# Patient Record
Sex: Female | Born: 1945 | Race: White | Hispanic: No | Smoking: Former smoker
Health system: Southern US, Community
[De-identification: ages and names within clinical notes are randomized; demographics above are authoritative.]

---

## 1997-08-21 ENCOUNTER — Encounter: Admission: RE | Admit: 1997-08-21 | Discharge: 1997-08-21 | Payer: Self-pay | Admitting: Family Medicine

## 1997-08-22 ENCOUNTER — Encounter: Admission: RE | Admit: 1997-08-22 | Discharge: 1997-08-22 | Payer: Self-pay | Admitting: Family Medicine

## 1997-08-28 ENCOUNTER — Encounter: Admission: RE | Admit: 1997-08-28 | Discharge: 1997-08-28 | Payer: Self-pay | Admitting: Family Medicine

## 1997-09-11 ENCOUNTER — Encounter: Admission: RE | Admit: 1997-09-11 | Discharge: 1997-09-11 | Payer: Self-pay | Admitting: Family Medicine

## 1997-12-11 ENCOUNTER — Encounter: Admission: RE | Admit: 1997-12-11 | Discharge: 1997-12-11 | Payer: Self-pay | Admitting: Family Medicine

## 1998-11-23 ENCOUNTER — Encounter: Admission: RE | Admit: 1998-11-23 | Discharge: 1998-11-23 | Payer: Self-pay | Admitting: Family Medicine

## 1999-02-15 ENCOUNTER — Encounter: Admission: RE | Admit: 1999-02-15 | Discharge: 1999-02-15 | Payer: Self-pay | Admitting: Sports Medicine

## 1999-02-15 ENCOUNTER — Encounter: Payer: Self-pay | Admitting: Sports Medicine

## 1999-02-22 ENCOUNTER — Ambulatory Visit (HOSPITAL_COMMUNITY): Admission: RE | Admit: 1999-02-22 | Discharge: 1999-02-22 | Payer: Self-pay | Admitting: Family Medicine

## 1999-02-22 ENCOUNTER — Encounter: Admission: RE | Admit: 1999-02-22 | Discharge: 1999-02-22 | Payer: Self-pay | Admitting: Family Medicine

## 1999-03-11 ENCOUNTER — Encounter: Admission: RE | Admit: 1999-03-11 | Discharge: 1999-03-11 | Payer: Self-pay | Admitting: Family Medicine

## 1999-09-02 ENCOUNTER — Encounter: Admission: RE | Admit: 1999-09-02 | Discharge: 1999-09-02 | Payer: Self-pay | Admitting: Family Medicine

## 1999-09-02 ENCOUNTER — Encounter: Payer: Self-pay | Admitting: Family Medicine

## 1999-09-19 ENCOUNTER — Encounter: Admission: RE | Admit: 1999-09-19 | Discharge: 1999-09-19 | Payer: Self-pay | Admitting: Family Medicine

## 1999-09-25 ENCOUNTER — Encounter: Admission: RE | Admit: 1999-09-25 | Discharge: 1999-09-25 | Payer: Self-pay | Admitting: Family Medicine

## 1999-09-30 ENCOUNTER — Encounter: Admission: RE | Admit: 1999-09-30 | Discharge: 1999-09-30 | Payer: Self-pay | Admitting: Family Medicine

## 1999-10-28 ENCOUNTER — Encounter: Admission: RE | Admit: 1999-10-28 | Discharge: 1999-10-28 | Payer: Self-pay | Admitting: Family Medicine

## 1999-11-25 ENCOUNTER — Encounter: Admission: RE | Admit: 1999-11-25 | Discharge: 1999-11-25 | Payer: Self-pay | Admitting: Family Medicine

## 1999-12-10 ENCOUNTER — Encounter: Admission: RE | Admit: 1999-12-10 | Discharge: 1999-12-10 | Payer: Self-pay | Admitting: Family Medicine

## 2000-01-06 ENCOUNTER — Encounter: Admission: RE | Admit: 2000-01-06 | Discharge: 2000-01-06 | Payer: Self-pay | Admitting: Family Medicine

## 2000-01-20 ENCOUNTER — Encounter: Admission: RE | Admit: 2000-01-20 | Discharge: 2000-01-20 | Payer: Self-pay | Admitting: Family Medicine

## 2000-01-27 ENCOUNTER — Encounter: Admission: RE | Admit: 2000-01-27 | Discharge: 2000-01-27 | Payer: Self-pay | Admitting: Family Medicine

## 2000-05-29 ENCOUNTER — Encounter: Admission: RE | Admit: 2000-05-29 | Discharge: 2000-05-29 | Payer: Self-pay | Admitting: Family Medicine

## 2000-05-29 ENCOUNTER — Encounter: Payer: Self-pay | Admitting: Family Medicine

## 2000-06-19 ENCOUNTER — Encounter: Admission: RE | Admit: 2000-06-19 | Discharge: 2000-06-19 | Payer: Self-pay | Admitting: Family Medicine

## 2000-07-17 ENCOUNTER — Encounter: Admission: RE | Admit: 2000-07-17 | Discharge: 2000-07-17 | Payer: Self-pay | Admitting: Family Medicine

## 2000-12-25 ENCOUNTER — Encounter: Admission: RE | Admit: 2000-12-25 | Discharge: 2000-12-25 | Payer: Self-pay | Admitting: Family Medicine

## 2001-03-15 ENCOUNTER — Encounter (INDEPENDENT_AMBULATORY_CARE_PROVIDER_SITE_OTHER): Payer: Self-pay | Admitting: Specialist

## 2001-03-15 ENCOUNTER — Encounter: Admission: RE | Admit: 2001-03-15 | Discharge: 2001-03-15 | Payer: Self-pay | Admitting: Family Medicine

## 2001-07-09 ENCOUNTER — Encounter: Admission: RE | Admit: 2001-07-09 | Discharge: 2001-07-09 | Payer: Self-pay | Admitting: Family Medicine

## 2001-07-22 ENCOUNTER — Encounter: Admission: RE | Admit: 2001-07-22 | Discharge: 2001-07-22 | Payer: Self-pay | Admitting: Family Medicine

## 2001-08-16 ENCOUNTER — Encounter: Admission: RE | Admit: 2001-08-16 | Discharge: 2001-08-16 | Payer: Self-pay | Admitting: Family Medicine

## 2001-09-08 ENCOUNTER — Encounter: Payer: Self-pay | Admitting: Sports Medicine

## 2001-09-08 ENCOUNTER — Encounter: Admission: RE | Admit: 2001-09-08 | Discharge: 2001-09-08 | Payer: Self-pay | Admitting: Sports Medicine

## 2001-09-14 ENCOUNTER — Encounter: Payer: Self-pay | Admitting: Family Medicine

## 2001-09-14 ENCOUNTER — Encounter: Admission: RE | Admit: 2001-09-14 | Discharge: 2001-09-14 | Payer: Self-pay | Admitting: Sports Medicine

## 2001-11-23 ENCOUNTER — Encounter: Admission: RE | Admit: 2001-11-23 | Discharge: 2001-11-23 | Payer: Self-pay | Admitting: Family Medicine

## 2002-04-01 ENCOUNTER — Encounter: Admission: RE | Admit: 2002-04-01 | Discharge: 2002-04-01 | Payer: Self-pay | Admitting: Family Medicine

## 2002-04-18 ENCOUNTER — Encounter: Admission: RE | Admit: 2002-04-18 | Discharge: 2002-04-18 | Payer: Self-pay | Admitting: Family Medicine

## 2002-04-22 ENCOUNTER — Encounter: Payer: Self-pay | Admitting: Sports Medicine

## 2002-04-22 ENCOUNTER — Encounter: Admission: RE | Admit: 2002-04-22 | Discharge: 2002-04-22 | Payer: Self-pay | Admitting: Family Medicine

## 2002-04-22 ENCOUNTER — Encounter: Admission: RE | Admit: 2002-04-22 | Discharge: 2002-04-22 | Payer: Self-pay | Admitting: Sports Medicine

## 2002-04-28 ENCOUNTER — Encounter: Admission: RE | Admit: 2002-04-28 | Discharge: 2002-04-28 | Payer: Self-pay | Admitting: Family Medicine

## 2002-05-16 ENCOUNTER — Encounter: Admission: RE | Admit: 2002-05-16 | Discharge: 2002-05-16 | Payer: Self-pay | Admitting: Family Medicine

## 2002-05-19 ENCOUNTER — Encounter: Admission: RE | Admit: 2002-05-19 | Discharge: 2002-05-19 | Payer: Self-pay | Admitting: Family Medicine

## 2002-05-19 ENCOUNTER — Encounter: Payer: Self-pay | Admitting: Family Medicine

## 2002-09-02 ENCOUNTER — Encounter: Payer: Self-pay | Admitting: Sports Medicine

## 2002-09-02 ENCOUNTER — Encounter: Admission: RE | Admit: 2002-09-02 | Discharge: 2002-09-02 | Payer: Self-pay | Admitting: Sports Medicine

## 2002-09-05 ENCOUNTER — Encounter: Admission: RE | Admit: 2002-09-05 | Discharge: 2002-09-05 | Payer: Self-pay | Admitting: Family Medicine

## 2002-12-19 ENCOUNTER — Encounter: Admission: RE | Admit: 2002-12-19 | Discharge: 2002-12-19 | Payer: Self-pay | Admitting: Sports Medicine

## 2003-03-17 ENCOUNTER — Encounter: Admission: RE | Admit: 2003-03-17 | Discharge: 2003-03-17 | Payer: Self-pay | Admitting: Family Medicine

## 2003-05-12 ENCOUNTER — Encounter (INDEPENDENT_AMBULATORY_CARE_PROVIDER_SITE_OTHER): Payer: Self-pay | Admitting: *Deleted

## 2003-05-19 ENCOUNTER — Other Ambulatory Visit: Admission: RE | Admit: 2003-05-19 | Discharge: 2003-05-19 | Payer: Self-pay | Admitting: Family Medicine

## 2003-11-27 ENCOUNTER — Ambulatory Visit: Payer: Self-pay | Admitting: Sports Medicine

## 2004-05-14 ENCOUNTER — Encounter: Admission: RE | Admit: 2004-05-14 | Discharge: 2004-05-14 | Payer: Self-pay | Admitting: Family Medicine

## 2004-10-26 ENCOUNTER — Inpatient Hospital Stay (HOSPITAL_COMMUNITY): Admission: EM | Admit: 2004-10-26 | Discharge: 2004-10-28 | Payer: Self-pay | Admitting: Emergency Medicine

## 2004-10-27 ENCOUNTER — Encounter (INDEPENDENT_AMBULATORY_CARE_PROVIDER_SITE_OTHER): Payer: Self-pay | Admitting: Specialist

## 2005-03-03 ENCOUNTER — Ambulatory Visit: Payer: Self-pay | Admitting: Sports Medicine

## 2005-03-20 ENCOUNTER — Ambulatory Visit: Payer: Self-pay | Admitting: Family Medicine

## 2005-05-09 ENCOUNTER — Ambulatory Visit: Payer: Self-pay | Admitting: Family Medicine

## 2005-07-02 ENCOUNTER — Encounter: Admission: RE | Admit: 2005-07-02 | Discharge: 2005-07-02 | Payer: Self-pay | Admitting: Family Medicine

## 2006-01-19 ENCOUNTER — Ambulatory Visit: Payer: Self-pay | Admitting: Family Medicine

## 2006-01-29 ENCOUNTER — Ambulatory Visit: Payer: Self-pay | Admitting: Internal Medicine

## 2006-03-04 ENCOUNTER — Encounter: Payer: Self-pay | Admitting: Family Medicine

## 2006-03-04 ENCOUNTER — Ambulatory Visit: Payer: Self-pay | Admitting: Internal Medicine

## 2006-03-09 ENCOUNTER — Ambulatory Visit: Payer: Self-pay | Admitting: Internal Medicine

## 2006-03-16 ENCOUNTER — Encounter (INDEPENDENT_AMBULATORY_CARE_PROVIDER_SITE_OTHER): Payer: Self-pay | Admitting: Specialist

## 2006-03-16 ENCOUNTER — Ambulatory Visit: Payer: Self-pay | Admitting: Internal Medicine

## 2006-04-09 DIAGNOSIS — M1990 Unspecified osteoarthritis, unspecified site: Secondary | ICD-10-CM

## 2006-04-09 DIAGNOSIS — E669 Obesity, unspecified: Secondary | ICD-10-CM | POA: Insufficient documentation

## 2006-04-09 DIAGNOSIS — M949 Disorder of cartilage, unspecified: Secondary | ICD-10-CM

## 2006-04-09 DIAGNOSIS — M899 Disorder of bone, unspecified: Secondary | ICD-10-CM | POA: Insufficient documentation

## 2006-04-09 DIAGNOSIS — J4489 Other specified chronic obstructive pulmonary disease: Secondary | ICD-10-CM | POA: Insufficient documentation

## 2006-04-09 DIAGNOSIS — J449 Chronic obstructive pulmonary disease, unspecified: Secondary | ICD-10-CM

## 2006-04-09 DIAGNOSIS — K210 Gastro-esophageal reflux disease with esophagitis: Secondary | ICD-10-CM

## 2006-04-09 DIAGNOSIS — F329 Major depressive disorder, single episode, unspecified: Secondary | ICD-10-CM

## 2006-04-10 ENCOUNTER — Encounter (INDEPENDENT_AMBULATORY_CARE_PROVIDER_SITE_OTHER): Payer: Self-pay | Admitting: *Deleted

## 2006-04-30 ENCOUNTER — Ambulatory Visit: Payer: Self-pay | Admitting: Family Medicine

## 2006-04-30 ENCOUNTER — Telehealth: Payer: Self-pay | Admitting: *Deleted

## 2006-04-30 DIAGNOSIS — J441 Chronic obstructive pulmonary disease with (acute) exacerbation: Secondary | ICD-10-CM | POA: Insufficient documentation

## 2006-07-22 ENCOUNTER — Encounter: Admission: RE | Admit: 2006-07-22 | Discharge: 2006-07-22 | Payer: Self-pay | Admitting: Family Medicine

## 2006-07-24 ENCOUNTER — Encounter: Payer: Self-pay | Admitting: Family Medicine

## 2006-11-02 ENCOUNTER — Encounter: Payer: Self-pay | Admitting: Family Medicine

## 2006-11-02 ENCOUNTER — Ambulatory Visit: Payer: Self-pay | Admitting: Family Medicine

## 2007-01-11 ENCOUNTER — Ambulatory Visit: Payer: Self-pay | Admitting: Family Medicine

## 2007-01-11 ENCOUNTER — Telehealth: Payer: Self-pay | Admitting: *Deleted

## 2007-01-14 ENCOUNTER — Telehealth: Payer: Self-pay | Admitting: *Deleted

## 2007-04-05 ENCOUNTER — Encounter: Payer: Self-pay | Admitting: Family Medicine

## 2007-06-23 ENCOUNTER — Telehealth: Payer: Self-pay | Admitting: *Deleted

## 2007-07-02 ENCOUNTER — Telehealth: Payer: Self-pay | Admitting: *Deleted

## 2007-07-02 ENCOUNTER — Ambulatory Visit: Payer: Self-pay | Admitting: Family Medicine

## 2007-07-02 ENCOUNTER — Encounter: Admission: RE | Admit: 2007-07-02 | Discharge: 2007-07-02 | Payer: Self-pay | Admitting: Family Medicine

## 2007-07-26 ENCOUNTER — Encounter: Admission: RE | Admit: 2007-07-26 | Discharge: 2007-07-26 | Payer: Self-pay | Admitting: Family Medicine

## 2007-08-06 ENCOUNTER — Inpatient Hospital Stay (HOSPITAL_COMMUNITY): Admission: EM | Admit: 2007-08-06 | Discharge: 2007-08-10 | Payer: Self-pay | Admitting: Emergency Medicine

## 2007-08-06 ENCOUNTER — Telehealth (INDEPENDENT_AMBULATORY_CARE_PROVIDER_SITE_OTHER): Payer: Self-pay | Admitting: *Deleted

## 2007-08-06 ENCOUNTER — Encounter: Payer: Self-pay | Admitting: Family Medicine

## 2007-08-06 ENCOUNTER — Ambulatory Visit: Payer: Self-pay | Admitting: Family Medicine

## 2007-08-12 ENCOUNTER — Telehealth: Payer: Self-pay | Admitting: Family Medicine

## 2007-08-16 ENCOUNTER — Ambulatory Visit: Payer: Self-pay | Admitting: Family Medicine

## 2007-08-16 LAB — CONVERTED CEMR LAB
HCT: 42.2 % (ref 36.0–46.0)
Hemoglobin: 13.3 g/dL (ref 12.0–15.0)
LDL Cholesterol: 63 mg/dL (ref 0–99)
Platelets: 362 10*3/uL (ref 150–400)
RBC: 4.35 M/uL (ref 3.87–5.11)
RDW: 13.8 % (ref 11.5–15.5)
Total CHOL/HDL Ratio: 2.8
Triglycerides: 333 mg/dL — ABNORMAL HIGH (ref <150)

## 2007-09-15 ENCOUNTER — Telehealth: Payer: Self-pay | Admitting: Family Medicine

## 2007-09-15 ENCOUNTER — Encounter: Admission: RE | Admit: 2007-09-15 | Discharge: 2007-09-15 | Payer: Self-pay | Admitting: Family Medicine

## 2007-11-02 ENCOUNTER — Telehealth: Payer: Self-pay | Admitting: *Deleted

## 2007-11-03 ENCOUNTER — Ambulatory Visit: Payer: Self-pay | Admitting: Family Medicine

## 2007-11-24 ENCOUNTER — Ambulatory Visit: Payer: Self-pay | Admitting: Family Medicine

## 2007-11-24 LAB — CONVERTED CEMR LAB
BUN: 11 mg/dL (ref 6–23)
Creatinine, Ser: 0.95 mg/dL (ref 0.40–1.20)
Glucose, Bld: 102 mg/dL — ABNORMAL HIGH (ref 70–99)
Potassium: 4.4 meq/L (ref 3.5–5.3)

## 2007-12-24 ENCOUNTER — Encounter: Payer: Self-pay | Admitting: *Deleted

## 2008-01-12 ENCOUNTER — Ambulatory Visit: Payer: Self-pay | Admitting: Family Medicine

## 2008-01-12 ENCOUNTER — Other Ambulatory Visit: Admission: RE | Admit: 2008-01-12 | Discharge: 2008-01-12 | Payer: Self-pay | Admitting: Family Medicine

## 2008-01-12 ENCOUNTER — Encounter: Payer: Self-pay | Admitting: Family Medicine

## 2008-01-12 LAB — CONVERTED CEMR LAB: Pap Smear: NORMAL

## 2008-02-19 ENCOUNTER — Emergency Department (HOSPITAL_COMMUNITY): Admission: EM | Admit: 2008-02-19 | Discharge: 2008-02-19 | Payer: Self-pay | Admitting: Family Medicine

## 2008-02-29 ENCOUNTER — Telehealth: Payer: Self-pay | Admitting: *Deleted

## 2008-08-02 ENCOUNTER — Encounter: Admission: RE | Admit: 2008-08-02 | Discharge: 2008-08-02 | Payer: Self-pay | Admitting: Family Medicine

## 2008-10-20 ENCOUNTER — Encounter: Payer: Self-pay | Admitting: Family Medicine

## 2008-11-03 ENCOUNTER — Ambulatory Visit: Payer: Self-pay | Admitting: Family Medicine

## 2008-11-03 ENCOUNTER — Telehealth: Payer: Self-pay | Admitting: Family Medicine

## 2008-11-20 ENCOUNTER — Encounter: Payer: Self-pay | Admitting: Family Medicine

## 2008-11-24 ENCOUNTER — Telehealth: Payer: Self-pay | Admitting: *Deleted

## 2008-11-29 ENCOUNTER — Ambulatory Visit: Payer: Self-pay | Admitting: Family Medicine

## 2008-12-05 ENCOUNTER — Telehealth: Payer: Self-pay | Admitting: Family Medicine

## 2009-02-14 ENCOUNTER — Ambulatory Visit: Payer: Self-pay | Admitting: Family Medicine

## 2009-02-14 ENCOUNTER — Telehealth: Payer: Self-pay | Admitting: Family Medicine

## 2009-02-23 ENCOUNTER — Ambulatory Visit: Payer: Self-pay | Admitting: Family Medicine

## 2009-03-09 ENCOUNTER — Ambulatory Visit: Payer: Self-pay | Admitting: Family Medicine

## 2009-03-09 ENCOUNTER — Encounter: Admission: RE | Admit: 2009-03-09 | Discharge: 2009-03-09 | Payer: Self-pay | Admitting: Family Medicine

## 2009-03-13 LAB — CONVERTED CEMR LAB
AST: 33 units/L (ref 0–37)
Alkaline Phosphatase: 174 units/L — ABNORMAL HIGH (ref 39–117)
BUN: 9 mg/dL (ref 6–23)
Glucose, Bld: 111 mg/dL — ABNORMAL HIGH (ref 70–99)
HCT: 39 % (ref 36.0–46.0)
IgE (Immunoglobulin E), Serum: 12 intl units/mL (ref 0.0–180.0)
MCV: 94.2 fL (ref 78.0–100.0)
Potassium: 4.4 meq/L (ref 3.5–5.3)
RBC: 4.14 M/uL (ref 3.87–5.11)
Sodium: 135 meq/L (ref 135–145)
TSH: 2.129 microintl units/mL (ref 0.350–4.500)
Total Protein: 7.2 g/dL (ref 6.0–8.3)
WBC: 12.3 10*3/uL — ABNORMAL HIGH (ref 4.0–10.5)

## 2009-03-16 ENCOUNTER — Ambulatory Visit: Payer: Self-pay | Admitting: Family Medicine

## 2009-03-30 ENCOUNTER — Encounter: Payer: Self-pay | Admitting: *Deleted

## 2009-08-06 ENCOUNTER — Telehealth: Payer: Self-pay | Admitting: Family Medicine

## 2009-08-07 ENCOUNTER — Ambulatory Visit: Payer: Self-pay | Admitting: Family Medicine

## 2009-08-09 ENCOUNTER — Ambulatory Visit: Payer: Self-pay | Admitting: Family Medicine

## 2009-08-09 DIAGNOSIS — M25579 Pain in unspecified ankle and joints of unspecified foot: Secondary | ICD-10-CM

## 2009-09-11 ENCOUNTER — Encounter: Admission: RE | Admit: 2009-09-11 | Discharge: 2009-09-11 | Payer: Self-pay | Admitting: Family Medicine

## 2009-11-19 ENCOUNTER — Ambulatory Visit: Payer: Self-pay | Admitting: Family Medicine

## 2009-11-19 DIAGNOSIS — L919 Hypertrophic disorder of the skin, unspecified: Secondary | ICD-10-CM

## 2009-11-19 DIAGNOSIS — L909 Atrophic disorder of skin, unspecified: Secondary | ICD-10-CM | POA: Insufficient documentation

## 2010-03-12 NOTE — Assessment & Plan Note (Signed)
Summary: f/u,df   Vital Signs:  Patient profile:   65 year old female Height:      60 inches Weight:      191.3 pounds BMI:     37.50 Temp:     97.7 degrees F oral Pulse rate:   121 / minute BP sitting:   132 / 84  (left arm) Cuff size:   regular  Vitals Entered By: Gladstone Pih (March 16, 2009 1:48 PM) CC: F/U Is Patient Diabetic? No Pain Assessment Patient in pain? no        Primary Care Provider:  Doralee Albino MD  CC:  F/U.  History of Present Illness: Slowly recovering from pneumonia.  Less cough.  Less SOB  Not yet near her baseline  Habits & Providers  Alcohol-Tobacco-Diet     Tobacco Status: never  Current Medications (verified): 1)  Spiriva Handihaler 18 Mcg  Caps (Tiotropium Bromide Monohydrate) .... One Inhalation Daily 2)  Albuterol 90 Mcg/act  Aers (Albuterol) .... One To Two Puffs Q4h As Needed For Wheezing.  Disp One Mdi 3)  Nortriptyline Hcl 50 Mg  Caps (Nortriptyline Hcl) .... One Tablet By Mouth in The Morning and Two Tablets By Mouth Before Bedtime 4)  Omeprazole 20 Mg  Tbec (Omeprazole) .... One Tablet By Mouth Once Daily 5)  Zyrtec Allergy 10 Mg Tabs (Cetirizine Hcl) .... Take 1 Tablet By Mouth Once A Day 6)  Qvar 80 Mcg/act Aers (Beclomethasone Dipropionate) .... Two Puff Daily 7)  Prednisone 20 Mg Tabs (Prednisone) .... One By Mouth Twice A Day For 10 Days. 8)  Doxycycline Hyclate 100 Mg Caps (Doxycycline Hyclate) .... Take 1 Tab Twice A Day  Allergies (verified): 1)  * Tessalon Pearles 2)  * Pneumovax 3)  * Dust Mites  Past History:  Past medical, surgical, family and social histories (including risk factors) reviewed, and no changes noted (except as noted below).  Past Medical History: Reviewed history from 04/30/2006 and no changes required. COPD  Past Surgical History: Reviewed history from 04/09/2006 and no changes required. Calcaneal BMD Tscore = -1.19 Right - 07/23/2001, Central BMD hipT=-1.3, spine T=-0.9 - 09/10/2001, EKG -  02/11/1999, S/P BTL -, spirometry - 02/11/1999  Family History: Reviewed history from 04/09/2006 and no changes required. - CAD, CVA, + Ca, DM, HBP  Social History: Reviewed history from 11/29/2008 and no changes required. quit smoking 1998; ETOH insignificant (6-8 beers on weekends); generally healthy Draws SSI disability for COPD and general healthSmoking Status:  never  Physical Exam  General:  Well-developed,well-nourished,in no acute distress; alert,appropriate and cooperative throughout examination Lungs:  Clear without wheeze   Impression & Recommendations:  Problem # 1:  CHRONIC OBSTRUCTIVE PULMONARY DISEASE, ACUTE EXACERBATION (ICD-491.21)  Pneumonia resolving but slowly.  Given Second rX for doxy that I do not expect her to fill unless worsens  Orders: [facility]- Est Level  3 (99213)  Complete Medication List: 1)  Spiriva Handihaler 18 Mcg Caps (Tiotropium bromide monohydrate) .... One inhalation daily 2)  Albuterol 90 Mcg/act Aers (Albuterol) .... One to two puffs q4h as needed for wheezing.  disp one mdi 3)  Nortriptyline Hcl 50 Mg Caps (Nortriptyline hcl) .... One tablet by mouth in the morning and two tablets by mouth before bedtime 4)  Omeprazole 20 Mg Tbec (Omeprazole) .... One tablet by mouth once daily 5)  Zyrtec Allergy 10 Mg Tabs (Cetirizine hcl) .... Take 1 tablet by mouth once a day 6)  Qvar 80 Mcg/act Aers (Beclomethasone dipropionate) .... Two  puff daily 7)  Prednisone 20 Mg Tabs (Prednisone) .... One by mouth twice a day for 10 days. 8)  Doxycycline Hyclate 100 Mg Caps (Doxycycline hyclate) .... Take 1 tab twice a day Prescriptions: DOXYCYCLINE HYCLATE 100 MG CAPS (DOXYCYCLINE HYCLATE) Take 1 tab twice a day  #20 x 0   Entered and Authorized by:   Doralee Albino MD   Signed by:   Doralee Albino MD on 03/16/2009   Method used:   [facility]        [facility]  [place] [address] [facility]       [address]       [place], [place]  [address]       Ph: [phone]  or [phone]       Fax: [phone]   RxID:   [id number]    Prevention & Chronic Care Immunizations   Influenza vaccine: Fluvax Non-MCR  (11/29/2008)   Influenza vaccine due: 11/23/2008    Tetanus booster: 12/11/2000: Done.   Tetanus booster due: 12/12/2010    Pneumococcal vaccine: Pneumovax  (03/09/2009)   Pneumococcal vaccine deferral: Contraindicated  (03/12/2009)   Pneumococcal vaccine due: None    H. zoster vaccine: Not documented  Colorectal Screening   Hemoccult: Done.  (05/11/2005)   Hemoccult due: Not Indicated    Colonoscopy: normal  (03/04/2006)   Colonoscopy due: 03/04/2016  Other Screening   Pap smear: normal  (01/12/2008)   Pap smear due: 01/12/2011    Mammogram: ASSESSMENT: Negative - BI-RADS 1^MM DIGITAL SCREENING  (08/02/2008)   Mammogram due: 08/02/2009    DXA bone density scan: Done.  (09/10/2001)   DXA scan due: None    Smoking status: never  (03/16/2009)  Lipids   Total Cholesterol: 201  (08/16/2007)   LDL: 63  (08/16/2007)   LDL Direct: Not documented   HDL: 71  (08/16/2007)   Triglycerides: 333  (08/16/2007)

## 2010-03-12 NOTE — Progress Notes (Signed)
Summary: triage   Phone Note Call from Patient Call back at [phone]   Caller: Patient Summary of Call: pt has been coughing up greenish and wants to know if something can be called in so that she doesn't have to bring her 65yo mother out. Initial call taken by: De Nurse,  February 14, 2009 10:17 AM  Follow-up for Phone Call        denies fever. productive cough since end of december.  feels bad. appt at 4:15 with Dr. Constance Goltz. she is aware she will not be seeing pcp Follow-up by: Golden Circle RN,  February 14, 2009 10:26 AM  Additional Follow-up for Phone Call Additional follow up Details #1::        noted and agree Additional Follow-up by: Doralee Albino MD,  February 14, 2009 11:27 AM

## 2010-03-12 NOTE — Assessment & Plan Note (Signed)
Summary: productive cough/[place]'s   Vital Signs:  Patient profile:   65 year old female Height:      60 inches Weight:      194.1 pounds BMI:     38.04 O2 Sat:      89 % on Room air Temp:     99.8 degrees F oral Pulse rate:   121 / minute BP sitting:   97 / 67  (right arm) Cuff size:   regular  Vitals Entered By: Garen Grams LPN (February 14, 2009 4:34 PM)  O2 Flow:  Room air CC: productive cough x 1 week Is Patient Diabetic? No Pain Assessment Patient in pain? yes     Location: body aches   Primary Care Provider:  Doralee Albino MD  CC:  productive cough x 1 week.  History of Present Illness: 65 yo female with COPD presenting with dyspnea at rest, productive cough and chest congestion x1 week.  No fever at home.  Using albuterol several times a day.  Using Robitussin without relief.  O2 sat is 89% in office.  Last recorded was 94%.  Habits & Providers  Alcohol-Tobacco-Diet     Tobacco Status: never  Allergies: 1)  * Tessalon Pearles  Social History: Smoking Status:  never  Physical Exam  Additional Exam:  VITALS:  Reviewed, hypoxic GEN: Alert & oriented, weak appearing NECK: Midline trachea, no masses/thyromegaly, no cervical lymphadenopathy CARDIO: Regular rate and rhythm, no murmurs/rubs/gallops, 2+ bilateral radial pulses RESP: Bilateral expiratory wheeze, normal work of breathing, no retractions/accessory muscle use    Impression & Recommendations:  Problem # 1:  COPD (ICD-496)  Given breathing treatment in clinic.  Sats in high 80s.  Prednisone and Augmentin x7 days.  f/u 2 days. Her updated medication list for this problem includes:    Spiriva Handihaler 18 Mcg Caps (Tiotropium bromide monohydrate) ..... One inhalation daily    Albuterol 90 Mcg/act Aers (Albuterol) ..... One to two puffs q4h as needed for wheezing.  disp one mdi    Qvar 80 Mcg/act Aers (Beclomethasone dipropionate) .Marland Kitchen..Marland Kitchen Two puff daily  Orders: [facility]- Est Level  3  (69629)  Complete Medication List: 1)  Spiriva Handihaler 18 Mcg Caps (Tiotropium bromide monohydrate) .... One inhalation daily 2)  Albuterol 90 Mcg/act Aers (Albuterol) .... One to two puffs q4h as needed for wheezing.  disp one mdi 3)  Nortriptyline Hcl 50 Mg Caps (Nortriptyline hcl) .... One tablet by mouth in the morning and two tablets by mouth before bedtime 4)  Omeprazole 20 Mg Tbec (Omeprazole) .... One tablet by mouth once daily 5)  Zyrtec Allergy 10 Mg Tabs (Cetirizine hcl) .... Take 1 tablet by mouth once a day 6)  Qvar 80 Mcg/act Aers (Beclomethasone dipropionate) .... Two puff daily 7)  Prednisone 50 Mg Tabs (Prednisone) .Marland Kitchen.. 1 tab by mouth daily x7 days 8)  Amoxicillin-pot Clavulanate 875-125 Mg Tabs (Amoxicillin-pot clavulanate) .Marland Kitchen.. 1 tab by mouth two times a day x7 days  Patient Instructions: 1)  I have sent in prescriptions for Augmentin and Prednisone. 2)  Please schedule a follow-up appointment in 2 days. Prescriptions: AMOXICILLIN-POT CLAVULANATE 875-125 MG TABS (AMOXICILLIN-POT CLAVULANATE) 1 tab by mouth two times a day x7 days  #14 x 0   Entered and Authorized by:   Romero Belling MD   Signed by:   Romero Belling MD on 02/14/2009   Method used:   [facility]  [place] [address]  [address]       [place], [place]  [address]       Ph: [phone] or [phone]       Fax: [phone]   RxID:   [id number] PREDNISONE 50 MG TABS (PREDNISONE) 1 tab by mouth daily x7 days  #7 x 0   Entered and Authorized by:   Romero Belling MD   Signed by:   Romero Belling MD on 02/14/2009   Method used:   [facility]        [facility]  [place] [address]       [address]       [place], [place]  [address]       Ph: [phone] or [phone]       Fax: [phone]   RxID:   [id number] DOXYCYCLINE HYCLATE 100 MG CAPS (DOXYCYCLINE HYCLATE) 1 cap by mouth two times a day x7 days  #14 x 0   Entered and Authorized by:   Romero Belling  MD   Signed by:   Romero Belling MD on 02/14/2009   Method used:   [facility]        [facility]  [place] [address] [facility]       [address]       [place], [place]  [address]       Ph: [phone] or [phone]       Fax: [phone]   RxID:   [id number]

## 2010-03-12 NOTE — Progress Notes (Signed)
Summary: triage   Phone Note Call from Patient   Caller: Patient Summary of Call: Pt has bronchitis and wondering if she can be seen today? Initial call taken by: Clydell Hakim,  August 06, 2009 11:10 AM  Follow-up for Phone Call        LM Follow-up by: Golden Circle RN,  August 06, 2009 11:51 AM  Additional Follow-up for Phone Call Additional follow up Details #1::        LM Additional Follow-up by: Golden Circle RN,  August 06, 2009 2:12 PM    Additional Follow-up for Phone Call Additional follow up Details #2::    LM  Follow-up by: Golden Circle RN,  August 06, 2009 4:42 PM  Additional Follow-up for Phone Call Additional follow up Details #3:: Details for Additional Follow-up Action Taken: noted Additional Follow-up by: Doralee Albino MD,  August 06, 2009 4:48 PM   Appended Document: triage pt can be reached at [phone]  Appended Document: triage told her to come now. she was SOB on the phone. has been sick for days. placed in work in. she knows she will not be seeing her pcp & that there may be a wait

## 2010-03-12 NOTE — Miscellaneous (Signed)
  NO SHOW.Loralee Pacas CMA  March 30, 2009 3:03 PM  Clinical Lists Changes

## 2010-03-12 NOTE — Assessment & Plan Note (Signed)
Summary: SOB & congested/[place]   Vital Signs:  Patient profile:   65 year old female Height:      59.75 inches Weight:      199.9 pounds BMI:     39.51 O2 Sat:      91 % on Room air Temp:     97.8 degrees F oral Pulse rate:   102 / minute BP sitting:   129 / 84  Vitals Entered By: Golden Circle RN (August 07, 2009 10:52 AM)  O2 Flow:  Room air  Serial Vital Signs/Assessments:                                PEF    PreRx  PostRx Time      O2 Sat  O2 Type     L/min  L/min  L/min   By 11:34 AM                                    92      Jessica Fleeger CMA  CC: SOB and congestion Is Patient Diabetic? No Pain Assessment Patient in pain? no        Primary Care Provider:  Doralee Albino MD  CC:  SOB and congestion.  History of Present Illness: SOB/congestion: started last wednesday or thursday.  she doesn't know what triggered it.  + SOB.  + productive cough.  no fevers.  often forget QVAR but has been taking spiriva and albuterol. albuterol is helping some.  overall staying the same in terms of symptoms and not getting better.    Habits & Providers  Alcohol-Tobacco-Diet     Tobacco Status: never  Current Medications (verified): 1)  Spiriva Handihaler 18 Mcg  Caps (Tiotropium Bromide Monohydrate) .... One Inhalation Daily 2)  Albuterol 90 Mcg/act  Aers (Albuterol) .... One To Two Puffs Q4h As Needed For Wheezing.  Disp One Mdi 3)  Nortriptyline Hcl 50 Mg  Caps (Nortriptyline Hcl) .... One Tablet By Mouth in The Morning and Two Tablets By Mouth Before Bedtime 4)  Omeprazole 20 Mg  Tbec (Omeprazole) .... One Tablet By Mouth Once Daily 5)  Zyrtec Allergy 10 Mg Tabs (Cetirizine Hcl) .... Take 1 Tablet By Mouth Once A Day 6)  Advair Diskus 250-50 Mcg/dose Aepb (Fluticasone-Salmeterol) .Marland Kitchen.. 1 Puff Inhaled Two Times A Day.  Meets Pa Criteria 7)  Prednisone 50 Mg Tabs (Prednisone) .Marland Kitchen.. 1 By Mouth Once Daily For 10 Days 8)  Doxycycline Hyclate 100 Mg Tabs (Doxycycline Hyclate)  .Marland Kitchen.. 1 By Mouth Two Times A Day For 10 Days.  Allergies (verified): 1)  * Tessalon Pearles 2)  * Pneumovax 3)  * Dust Mites  Past History:  Past medical, surgical, family and social histories (including risk factors) reviewed for relevance to current acute and chronic problems.  Past Medical History: Reviewed history from 04/30/2006 and no changes required. COPD  Past Surgical History: Reviewed history from 04/09/2006 and no changes required. Calcaneal BMD Tscore = -1.19 Right - 07/23/2001, Central BMD hipT=-1.3, spine T=-0.9 - 09/10/2001, EKG - 02/11/1999, S/P BTL -, spirometry - 02/11/1999  Family History: Reviewed history from 04/09/2006 and no changes required. - CAD, CVA, + Ca, DM, HBP  Social History: Reviewed history from 11/29/2008 and no changes required. quit smoking 1998; ETOH insignificant (6-8 beers on weekends); generally healthy Draws SSI disability for COPD and  general health  Review of Systems       per HPI  Physical Exam  General:  Well-developed,well-nourished,in no acute distress; alert,appropriate and cooperative throughout examination VS noted - O2 sat only 90% on RA prior to neb Ears:  no external deformities.   Nose:  no rhinorrhea Mouth:  Oral mucosa and oropharynx without lesions or exudates.  Neck:  No deformities, masses, or tenderness noted. Lungs:  diffuse wheeze primarily expiratory but some also inspiratory. air movement present throughout chest.  normal work of breathing without retractions, etc  post neb: diminished wheeze though still somewhat present at end expiratory wheeze.  good airmovement throughout chest.  patient states breathing feels slightly better Heart:  Normal rate and regular rhythm. S1 and S2 normal without gallop, murmur, click, rub or other extra sounds.   Impression & Recommendations:  Problem # 1:  CHRONIC OBSTRUCTIVE PULMONARY DISEASE, ACUTE EXACERBATION (ICD-491.21)  rx with prednisone, doxycycline increase frequency  of albuterol change qvar to advair (has more data).  may need to do prior authroization form.  f/u 2 days.  given dose of solumedrol in clinic and nebulized albuterol/atrovent  Orders: [facility]- Est  Level 4 (57846) Solumedrol up to 40mg  (N6295) Albuterol Sulfate Sol 1mg  unit dose (M8413) Atrovent 1mg  (Neb) (K4401)  Complete Medication List: 1)  Spiriva Handihaler 18 Mcg Caps (Tiotropium bromide monohydrate) .... One inhalation daily 2)  Albuterol 90 Mcg/act Aers (Albuterol) .... One to two puffs q4h as needed for wheezing.  disp one mdi 3)  Nortriptyline Hcl 50 Mg Caps (Nortriptyline hcl) .... One tablet by mouth in the morning and two tablets by mouth before bedtime 4)  Omeprazole 20 Mg Tbec (Omeprazole) .... One tablet by mouth once daily 5)  Zyrtec Allergy 10 Mg Tabs (Cetirizine hcl) .... Take 1 tablet by mouth once a day 6)  Advair Diskus 250-50 Mcg/dose Aepb (Fluticasone-salmeterol) .Marland Kitchen.. 1 puff inhaled two times a day.  meets pa criteria 7)  Prednisone 50 Mg Tabs (Prednisone) .Marland Kitchen.. 1 by mouth once daily for 10 days 8)  Doxycycline Hyclate 100 Mg Tabs (Doxycycline hyclate) .Marland Kitchen.. 1 by mouth two times a day for 10 days.  Patient Instructions: 1)  Please follow up in 2 days for your breathing. 2)  Pick up the new purple disc (Advair) and use it twice daily every day. Stop your QVAR.  3)  Use your albuterol every 4-6 hours - 2 puffs until your breathing eases up. 4)  Use your spiriva daily. 5)  Pick up your prednisone and doxycyline and take them until all gone  6)  If your breathing gets worse or you get fevers, etc with it please let us know right away. Prescriptions: PREDNISONE 50 MG TABS (PREDNISONE) 1 by mouth once daily for 10 days  #10 x 0   Entered and Authorized by:   Ancil Boozer  MD   Signed by:   Ancil Boozer  MD on 08/07/2009   Method used:   [facility]  [place] [address] [facility]       [address]       [place], [place]  [address]       Ph:  [phone] or [phone]       Fax: [phone]   RxID:   [id number] DOXYCYCLINE HYCLATE 100 MG TABS (DOXYCYCLINE HYCLATE) 1 by mouth two times a day for 10 days.  #20 x 0   Entered and Authorized by:  Ancil Boozer  MD   Signed by:   Ancil Boozer  MD on 08/07/2009   Method used:   [facility]        [facility]  [place] [address]       [address]       [place], [place]  [address]       Ph: [phone] or [phone]       Fax: [phone]   RxID:   [id number] ADVAIR DISKUS 250-50 MCG/DOSE AEPB (FLUTICASONE-SALMETEROL) 1 puff inhaled two times a day.  meets PA criteria  #1 x 11   Entered and Authorized by:   Ancil Boozer  MD   Signed by:   Ancil Boozer  MD on 08/07/2009   Method used:   [facility]        [facility]  [place] [address]       [address]       [place], [place]  [address]       Ph: [phone] or [phone]       Fax: [phone]   RxID:   [id number]    Medication Administration  Injection # 1:    Medication: Solumedrol up to 40mg     Diagnosis: CHRONIC OBSTRUCTIVE PULMONARY DISEASE, ACUTE EXACERBATION (ICD-491.21)    Route: IM    Site: LUOQ gluteus    Exp Date: 09/11/2010    Lot #: [id number]    Mfr: upjohn    Patient tolerated injection without complications    Given by: Jone Baseman CMA (August 07, 2009 11:56 AM)  Medication # 1:    Medication: Albuterol Sulfate Sol 1mg  unit dose    Diagnosis: CHRONIC OBSTRUCTIVE PULMONARY DISEASE, ACUTE EXACERBATION (ICD-491.21)    Dose: 2.5mg     Route: inhaled    Exp Date: 01/11/2011    Lot #: [id number]    Mfr: nephon    Patient tolerated medication without complications    Given by: Jone Baseman CMA (August 07, 2009 11:57 AM)  Medication # 2:    Medication: Atrovent 1mg  (Neb)    Diagnosis: CHRONIC OBSTRUCTIVE PULMONARY DISEASE, ACUTE EXACERBATION (ICD-491.21)    Dose: 0.5mg     Route: inhaled    Exp Date: 09/11/2010    Lot #: [id number]    Mfr: nephron    Patient tolerated  medication without complications    Given by: Jone Baseman CMA (August 07, 2009 11:58 AM)  Orders Added: 1)  [facility]- Est  Level 4 [62376] 2)  Solumedrol up to 40mg  [J2920] 3)  Albuterol Sulfate Sol 1mg  unit dose [J7613] 4)  Atrovent 1mg  (Neb) [E8315]

## 2010-03-12 NOTE — Assessment & Plan Note (Signed)
Summary: F/U/KH   Vital Signs:  Patient profile:   65 year old female Height:      59.75 inches Weight:      201 pounds BMI:     39.73 O2 Sat:      94 % on Room air Temp:     98.3 degrees F oral Pulse rate:   111 / minute BP sitting:   134 / 87  (right arm) Cuff size:   regular  Vitals Entered By: Garen Grams LPN (August 09, 2009 2:21 PM)  O2 Flow:  Room air CC: f/u congestion/SOB, L foot pain Is Patient Diabetic? No Pain Assessment Patient in pain? yes     Location: left foot   Primary Care Provider:  Doralee Albino MD  CC:  f/u congestion/SOB and L foot pain.  History of Present Illness: congestion/SOB: feeling much better.  much easier to breathe.  still not 100% but can tell a big difference.  denies fevers.  using medications as prescribed.   L foot pain: has been there for about 1 week.  no known injury.  hurts primarily in heel but has swelling all around foot.  no h/o gout.   Habits & Providers  Alcohol-Tobacco-Diet     Tobacco Status: never  Current Medications (verified): 1)  Spiriva Handihaler 18 Mcg  Caps (Tiotropium Bromide Monohydrate) .... One Inhalation Daily 2)  Albuterol 90 Mcg/act  Aers (Albuterol) .... One To Two Puffs Q4h As Needed For Wheezing.  Disp One Mdi 3)  Nortriptyline Hcl 50 Mg  Caps (Nortriptyline Hcl) .... One Tablet By Mouth in The Morning and Two Tablets By Mouth Before Bedtime 4)  Omeprazole 20 Mg  Tbec (Omeprazole) .... One Tablet By Mouth Once Daily 5)  Zyrtec Allergy 10 Mg Tabs (Cetirizine Hcl) .... Take 1 Tablet By Mouth Once A Day 6)  Advair Diskus 250-50 Mcg/dose Aepb (Fluticasone-Salmeterol) .Marland Kitchen.. 1 Puff Inhaled Two Times A Day.  Meets Pa Criteria 7)  Prednisone 50 Mg Tabs (Prednisone) .Marland Kitchen.. 1 By Mouth Once Daily For 10 Days 8)  Doxycycline Hyclate 100 Mg Tabs (Doxycycline Hyclate) .Marland Kitchen.. 1 By Mouth Two Times A Day For 10 Days.  Allergies (verified): 1)  * Tessalon Pearles 2)  * Pneumovax 3)  * Dust Mites  Past  History:  Past medical, surgical, family and social histories (including risk factors) reviewed for relevance to current acute and chronic problems.  Past Medical History: REFLUX ESOPHAGITIS (ICD-530.11) OSTEOPENIA (ICD-733.90) OSTEOARTHRITIS, MULTI SITES (ICD-715.98) OBESITY, NOS (ICD-278.00) DEPRESSIVE DISORDER, NOS (ICD-311) COPD (ICD-496)  Past Surgical History: Reviewed history from 04/09/2006 and no changes required. Calcaneal BMD Tscore = -1.19 Right - 07/23/2001, Central BMD hipT=-1.3, spine T=-0.9 - 09/10/2001, EKG - 02/11/1999, S/P BTL -, spirometry - 02/11/1999  Family History: Reviewed history from 04/09/2006 and no changes required. - CAD, CVA, + Ca, DM, HBP  Social History: Reviewed history from 11/29/2008 and no changes required. quit smoking 1998; ETOH insignificant (6-8 beers on weekends); generally healthy Draws SSI disability for COPD and general health  Review of Systems       per HPI  Physical Exam  General:  Well-developed,well-nourished,in no acute distress; alert,appropriate and cooperative throughout examination VS noted - O2 sat improved Lungs:  CTAB.  normal work of breathing.   Heart:  Normal rate and regular rhythm. S1 and S2 normal without gallop, murmur, click, rub or other extra sounds. Msk:  L foot swollen and warm slightly around joint and achilles.  no erythema.  FROM. normal thompson's  test.     Impression & Recommendations:  Problem # 1:  CHRONIC OBSTRUCTIVE PULMONARY DISEASE, ACUTE EXACERBATION (ICD-491.21) Assessment Improved  improved.  continue current regimen with red flags.  Orders: [facility]- Est Level  3 (60454)  Problem # 2:  ANKLE PAIN, LEFT (ICD-719.47) Assessment: New  unclear etiology at this time - ? sprain, ? achilles tendonitis ? arthritis, ? gout i would have expected most of these things to have improved with prednisone  no fevers is reassurring for no infectious process for now will give a trial of immobilization with OTC  ankle brace and hope it either improves or localizes to one problem. f/u if not improving in 2 weeks or if worse.    Orders: [facility]- Est Level  3 (99213)  Complete Medication List: 1)  Spiriva Handihaler 18 Mcg Caps (Tiotropium bromide monohydrate) .... One inhalation daily 2)  Albuterol 90 Mcg/act Aers (Albuterol) .... One to two puffs q4h as needed for wheezing.  disp one mdi 3)  Nortriptyline Hcl 50 Mg Caps (Nortriptyline hcl) .... One tablet by mouth in the morning and two tablets by mouth before bedtime 4)  Omeprazole 20 Mg Tbec (Omeprazole) .... One tablet by mouth once daily 5)  Zyrtec Allergy 10 Mg Tabs (Cetirizine hcl) .... Take 1 tablet by mouth once a day 6)  Advair Diskus 250-50 Mcg/dose Aepb (Fluticasone-salmeterol) .Marland Kitchen.. 1 puff inhaled two times a day.  meets pa criteria 7)  Prednisone 50 Mg Tabs (Prednisone) .Marland Kitchen.. 1 by mouth once daily for 10 days 8)  Doxycycline Hyclate 100 Mg Tabs (Doxycycline hyclate) .Marland Kitchen.. 1 by mouth two times a day for 10 days.  Patient Instructions: 1)  If the breathing gets worse again or you get fever let us know right away.  Finish up your prednisone and doxycycline. Continue the advair as well. 2)  For the foot/ankle: get an ankle brace to wear for the next week or so.  Prop the foot up when you can.  You can also use some ice as needed for the ankle.  If it gets significantly worse or isn't any better in 2 weeks call for an appt to be seen.

## 2010-03-12 NOTE — Assessment & Plan Note (Signed)
Summary: f/up for cough,tcb   Vital Signs:  Patient profile:   65 year old female Weight:      191.4 pounds O2 Sat:      90 % on Room air Temp:     98.6 degrees F oral Pulse rate:   119 / minute Pulse rhythm:   regular BP sitting:   142 / 83  (right arm) Cuff size:   regular  Vitals Entered By: Loralee Pacas CMA (March 09, 2009 1:38 PM)  O2 Flow:  Room air Comments pt states that she hasn't been feeling well x 4 days    Primary Care Provider:  Doralee Albino MD   History of Present Illness: Has had multiple respiratory illnesses (or perhaps one recurrent illness)  Since 02/14/09.  Also had respiratory illness in Oct/10 and Sept/10.  20-25 pack year hx - quit more than 10 years ago.  No fever.  Lots of productive cough.  No recent CXR. Cannot walk up one flight of steps without stopping due to dyspnea - this dyspnea is even on a good day.    contact number is [phone] and cell is [phone]  Habits & Providers  Alcohol-Tobacco-Diet     Tobacco Status: quit > 6 months     Year Quit: 1998     Pack years: 20-25  Current Medications (verified): 1)  Spiriva Handihaler 18 Mcg  Caps (Tiotropium Bromide Monohydrate) .... One Inhalation Daily 2)  Albuterol 90 Mcg/act  Aers (Albuterol) .... One To Two Puffs Q4h As Needed For Wheezing.  Disp One Mdi 3)  Nortriptyline Hcl 50 Mg  Caps (Nortriptyline Hcl) .... One Tablet By Mouth in The Morning and Two Tablets By Mouth Before Bedtime 4)  Omeprazole 20 Mg  Tbec (Omeprazole) .... One Tablet By Mouth Once Daily 5)  Zyrtec Allergy 10 Mg Tabs (Cetirizine Hcl) .... Take 1 Tablet By Mouth Once A Day 6)  Qvar 80 Mcg/act Aers (Beclomethasone Dipropionate) .... Two Puff Daily 7)  Prednisone 20 Mg Tabs (Prednisone) .... One By Mouth Twice A Day For 10 Days. 8)  Doxycycline Hyclate 100 Mg Caps (Doxycycline Hyclate) .... Take 1 Tab Twice A Day  Allergies (verified): 1)  * Tessalon Pearles  Past History:  Past medical, surgical, family and  social histories (including risk factors) reviewed, and no changes noted (except as noted below).  Past Medical History: Reviewed history from 04/30/2006 and no changes required. COPD  Past Surgical History: Reviewed history from 04/09/2006 and no changes required. Calcaneal BMD Tscore = -1.19 Right - 07/23/2001, Central BMD hipT=-1.3, spine T=-0.9 - 09/10/2001, EKG - 02/11/1999, S/P BTL -, spirometry - 02/11/1999  Family History: Reviewed history from 04/09/2006 and no changes required. - CAD, CVA, + Ca, DM, HBP  Social History: Reviewed history from 11/29/2008 and no changes required. quit smoking 1998; ETOH insignificant (6-8 beers on weekends); generally healthy Draws SSI disability for COPD and general healthSmoking Status:  quit > 6 months  Physical Exam  General:  Coughing, No respiratory distress at rest but does get SOB with coughing spells Head:  Normocephalic and atraumatic without obvious abnormalities. No apparent alopecia or balding. Mouth:  Oral mucosa and oropharynx without lesions or exudates.  Teeth in good repair. Neck:  No deformities, masses, or tenderness noted. Lungs:  Diffuse wheezes and rhonchi Heart:  Normal rate and regular rhythm. S1 and S2 normal without gallop, murmur, click, rub or other extra sounds. Abdomen:  Bowel sounds positive,abdomen soft and non-tender without masses, organomegaly or hernias  noted.   Impression & Recommendations:  Problem # 1:  CHRONIC OBSTRUCTIVE PULMONARY DISEASE, ACUTE EXACERBATION (ICD-491.21) Pneumonia on CXR Orders: CXR- 2view (CXR) Comp Met-[facility] (95638-75643) CBC-[facility] (32951) Miscellaneous Lab Charge-[facility] (88416) [facility]- Est Level  3 (60630)  Complete Medication List: 1)  Spiriva Handihaler 18 Mcg Caps (Tiotropium bromide monohydrate) .... One inhalation daily 2)  Albuterol 90 Mcg/act Aers (Albuterol) .... One to two puffs q4h as needed for wheezing.  disp one mdi 3)  Nortriptyline Hcl 50 Mg Caps (Nortriptyline hcl) .... One  tablet by mouth in the morning and two tablets by mouth before bedtime 4)  Omeprazole 20 Mg Tbec (Omeprazole) .... One tablet by mouth once daily 5)  Zyrtec Allergy 10 Mg Tabs (Cetirizine hcl) .... Take 1 tablet by mouth once a day 6)  Qvar 80 Mcg/act Aers (Beclomethasone dipropionate) .... Two puff daily 7)  Prednisone 20 Mg Tabs (Prednisone) .... One by mouth twice a day for 10 days. 8)  Doxycycline Hyclate 100 Mg Caps (Doxycycline hyclate) .... Take 1 tab twice a day  Other Orders: Pulse Oximetry- [facility] (94760) TSH-[facility] (16010-93235) Pneumococcal Vaccine (57322) Admin 1st Vaccine (02542)  Patient Instructions: 1)  Please schedule a follow-up appointment in 2 weeks.  2)  You should have a new inhalor QVAR - start using now.  It takes the place of the flovent. Prescriptions: DOXYCYCLINE HYCLATE 100 MG CAPS (DOXYCYCLINE HYCLATE) Take 1 tab twice a day  #20 x 0   Entered and Authorized by:   Doralee Albino MD   Signed by:   Doralee Albino MD on 03/09/2009   Method used:   [facility] to        [facility]  [place] [address] (retail)       [address]       [place], [place]  [address]       Ph: [phone] or [phone]       Fax: [phone]   RxID:   [id number] PREDNISONE 20 MG TABS (PREDNISONE) one by mouth twice a day for 10 days.  #20 x 0   Entered and Authorized by:   Doralee Albino MD   Signed by:   Doralee Albino MD on 03/09/2009   Method used:   [facility]        [facility]  [place] [address]       [address]       [place], [place]  [address]       Ph: [phone] or [phone]       Fax: [phone]   RxID:   [id number]    Prevention & Chronic Care Immunizations   Influenza vaccine: Fluvax Non-MCR  (11/29/2008)   Influenza vaccine due: 11/23/2008    Tetanus booster: 12/11/2000: Done.   Tetanus booster due: 12/12/2010    Pneumococcal vaccine: Pneumovax  (03/09/2009)   Pneumococcal vaccine due: None    H. zoster vaccine: Not  documented  Colorectal Screening   Hemoccult: Done.  (05/11/2005)   Hemoccult due: Not Indicated    Colonoscopy: normal  (03/04/2006)   Colonoscopy due: 03/04/2016  Other Screening   Pap smear: normal  (01/12/2008)   Pap smear due: 01/12/2011    Mammogram: ASSESSMENT: Negative - BI-RADS 1^MM DIGITAL SCREENING  (08/02/2008)   Mammogram due: 08/02/2009    DXA bone density scan: Done.  (09/10/2001)   DXA scan due: None    Smoking status: quit > 6 months  (03/09/2009)  Lipids   Total Cholesterol: 201  (  08/16/2007)   LDL: 63  (08/16/2007)   LDL Direct: Not documented   HDL: 71  (08/16/2007)   Triglycerides: 333  (08/16/2007)   Nursing Instructions: Give Pneumovax today     Immunizations Administered:  Pneumonia Vaccine:    Vaccine Type: Pneumovax    Site: right deltoid    Mfr: Merck    Dose: 0.5 ml    Route: IM    Given by: Gladstone Pih    Exp. Date: 05/31/2010    Lot #: [id number]    VIS given: 09/08/95 version given March 09, 2009.    Physician counseled: yes   Appended Document: f/up for cough,tcb Called to insure pneumonia improving on doxy.  She feels better.  Will call with RAST screen results.  She informed me that she had a significant local reaction to the pneumovax.  Given her age, will make this the last pneumovax and label her as allergic.    Clinical Lists Changes  Allergies: Added new allergy or adverse reaction of * PNEUMOVAX Observations: Added new observation of PNEUVAXDECLN: Contraindicated (03/12/2009 10:00) Added new observation of DM PROGRESS: N/A (03/12/2009 10:00) Added new observation of DM FSREVIEW: N/A (03/12/2009 10:00) Added new observation of HTN PROGRESS: N/A (03/12/2009 10:00) Added new observation of HTN FSREVIEW: N/A (03/12/2009 10:00) Added new observation of LIPID PROGRS: N/A (03/12/2009 10:00) Added new observation of LIPID FSREVW: N/A (03/12/2009 10:00)       Prevention & Chronic Care Immunizations   Influenza  vaccine: Fluvax Non-MCR  (11/29/2008)   Influenza vaccine due: 11/23/2008    Tetanus booster: 12/11/2000: Done.   Tetanus booster due: 12/12/2010    Pneumococcal vaccine: Pneumovax  (03/09/2009)   Pneumococcal vaccine deferral: Contraindicated  (03/12/2009)   Pneumococcal vaccine due: None    H. zoster vaccine: Not documented  Colorectal Screening   Hemoccult: Done.  (05/11/2005)   Hemoccult due: Not Indicated    Colonoscopy: normal  (03/04/2006)   Colonoscopy due: 03/04/2016  Other Screening   Pap smear: normal  (01/12/2008)   Pap smear due: 01/12/2011    Mammogram: ASSESSMENT: Negative - BI-RADS 1^MM DIGITAL SCREENING  (08/02/2008)   Mammogram due: 08/02/2009    DXA bone density scan: Done.  (09/10/2001)   DXA scan due: None    Smoking status: quit > 6 months  (03/09/2009)  Lipids   Total Cholesterol: 201  (08/16/2007)   LDL: 63  (08/16/2007)   LDL Direct: Not documented   HDL: 71  (08/16/2007)   Triglycerides: 333  (08/16/2007)

## 2010-03-12 NOTE — Assessment & Plan Note (Signed)
Summary: mole removal,tcb   Vital Signs:  Patient profile:   65 year old female Weight:      202.8 pounds Pulse rate:   98 / minute BP sitting:   140 / 80  (right arm)  Vitals Entered By: Renato Battles slade,cma CC: check moles. skin tags? flu shot given. Is Patient Diabetic? No Pain Assessment Patient in pain? no        Primary Care Provider:  Doralee Albino MD  CC:  check moles. skin tags? flu shot given.Marland Kitchen  History of Present Illness: Here for skin tag removal.  Noted last visit Also complains of increasing stress and anxiety.  She is the primary caregiver for her mother who has progressive Altzheimers  Habits & Providers  Alcohol-Tobacco-Diet     Tobacco Status: quit  Current Medications (verified): 1)  Spiriva Handihaler 18 Mcg  Caps (Tiotropium Bromide Monohydrate) .... One Inhalation Daily 2)  Albuterol 90 Mcg/act  Aers (Albuterol) .... One To Two Puffs Q4h As Needed For Wheezing.  Disp One Mdi 3)  Nortriptyline Hcl 50 Mg  Caps (Nortriptyline Hcl) .... One Tablet By Mouth in The Morning and Two Tablets By Mouth Before Bedtime 4)  Omeprazole 20 Mg  Tbec (Omeprazole) .... One Tablet By Mouth Once Daily 5)  Zyrtec Allergy 10 Mg Tabs (Cetirizine Hcl) .... Take 1 Tablet By Mouth Once A Day 6)  Advair Diskus 250-50 Mcg/dose Aepb (Fluticasone-Salmeterol) .Marland Kitchen.. 1 Puff Inhaled Two Times A Day.  Meets Pa Criteria 7)  Lorazepam 0.5 Mg Tabs (Lorazepam) .... One By Mouth Two Times A Day As Needed Anxiety  Allergies (verified): 1)  * Tessalon Pearles 2)  * Pneumovax 3)  * Dust Mites  Past History:  Past medical, surgical, family and social histories (including risk factors) reviewed, and no changes noted (except as noted below).  Past Medical History: Reviewed history from 08/09/2009 and no changes required. REFLUX ESOPHAGITIS (ICD-530.11) OSTEOPENIA (ICD-733.90) OSTEOARTHRITIS, MULTI SITES (ICD-715.98) OBESITY, NOS (ICD-278.00) DEPRESSIVE DISORDER, NOS (ICD-311) COPD  (ICD-496)  Past Surgical History: Reviewed history from 04/09/2006 and no changes required. Calcaneal BMD Tscore = -1.19 Right - 07/23/2001, Central BMD hipT=-1.3, spine T=-0.9 - 09/10/2001, EKG - 02/11/1999, S/P BTL -, spirometry - 02/11/1999  Family History: Reviewed history from 04/09/2006 and no changes required. - CAD, CVA, + Ca, DM, HBP  Social History: Reviewed history from 11/29/2008 and no changes required. quit smoking 1998; ETOH insignificant (6-8 beers on weekends); generally healthy Draws SSI disability for COPD and general healthSmoking Status:  quit  Physical Exam  General:  Well-developed,well-nourished,in no acute distress; alert,appropriate and cooperative throughout examination Skin:  After informed consent and time out, Multiple (Approx 25) skin tags removed from neck, both axilla and one from midline at breast cleavage removed with freeze spray anesthesia and snipped. Not sent for path because all were typical skin tags.   Impression & Recommendations:  Problem # 1:  DEPRESSIVE DISORDER, NOS (ICD-311)  Now with mostly anxiety.  Add lorazepam in small dose. Her updated medication list for this problem includes:    Nortriptyline Hcl 50 Mg Caps (Nortriptyline hcl) ..... One tablet by mouth in the morning and two tablets by mouth before bedtime    Lorazepam 0.5 Mg Tabs (Lorazepam) ..... One by mouth two times a day as needed anxiety  Orders: [facility]- Est Level  3 (99213)  Problem # 2:  SKIN TAG (ICD-701.9)  Orders: [facility]- Est Level  3 (16109) Skin Tags (up to 15) - [facility] (11200)  Complete Medication  List: 1)  Spiriva Handihaler 18 Mcg Caps (Tiotropium bromide monohydrate) .... One inhalation daily 2)  Albuterol 90 Mcg/act Aers (Albuterol) .... One to two puffs q4h as needed for wheezing.  disp one mdi 3)  Nortriptyline Hcl 50 Mg Caps (Nortriptyline hcl) .... One tablet by mouth in the morning and two tablets by mouth before bedtime 4)  Omeprazole 20 Mg Tbec (Omeprazole)  .... One tablet by mouth once daily 5)  Zyrtec Allergy 10 Mg Tabs (Cetirizine hcl) .... Take 1 tablet by mouth once a day 6)  Advair Diskus 250-50 Mcg/dose Aepb (Fluticasone-salmeterol) .Marland Kitchen.. 1 puff inhaled two times a day.  meets pa criteria 7)  Lorazepam 0.5 Mg Tabs (Lorazepam) .... One by mouth two times a day as needed anxiety  Other Orders: Influenza Vaccine NON MCR (44034) Prescriptions: ZYRTEC ALLERGY 10 MG TABS (CETIRIZINE HCL) Take 1 tablet by mouth once a day  #90 x 3   Entered and Authorized by:   Doralee Albino MD   Signed by:   Doralee Albino MD on 11/19/2009   Method used:   [facility]        [facility]  [place] [address] [facility]       [address]       [place], [place]  [address]       Ph: [phone] or [phone]       Fax: [phone]   RxID:   [id number] LORAZEPAM 0.5 MG TABS (LORAZEPAM) One by mouth two times a day as needed anxiety  #30 x 5   Entered and Authorized by:   Doralee Albino MD   Signed by:   Doralee Albino MD on 11/19/2009   Method used:   Handwritten   RxID:   [id number]    Influenza Vaccine    Vaccine Type: Fluvax Non-MCR    Site: left deltoid    Mfr: GlaxoSmithKline    Dose: 0.5 ml    Route: IM    Given by: Arlyss Repress CMA,    Exp. Date: 08/07/2010    Lot #: [id number]    VIS given: 09/04/09 version given November 19, 2009.  Flu Vaccine Consent Questions    Do you have a history of severe allergic reactions to this vaccine? no    Any prior history of allergic reactions to egg and/or gelatin? no    Do you have a sensitivity to the preservative Thimersol? no    Do you have a past history of Guillan-Barre Syndrome? no    Do you currently have an acute febrile illness? no    Have you ever had a severe reaction to latex? no    Vaccine information given and explained to patient? yes    Are you currently pregnant? no

## 2010-03-12 NOTE — Assessment & Plan Note (Signed)
Summary:  cough,tcb   Vital Signs:  Patient profile:   65 year old female Weight:      196.8 pounds Temp:     98 degrees F oral Pulse rate:   92 / minute BP sitting:   120 / 78  (right arm)  Vitals Entered By: Renato Battles slade,cma CC: f/up cough. had leak in water heater, which was in her closet. had mold in her bedroom. cough is better. Is Patient Diabetic? No Pain Assessment Patient in pain? no        Primary Care Provider:  Doralee Albino MD  CC:  f/up cough. had leak in water heater and which was in her closet. had mold in her bedroom. cough is better.Marland Kitchen  History of Present Illness: Folllow up for cough and COPD flair, she believes related to mold in her bedroom.  She reports being symptom free.  She has all her inhalers, using them correctly, altough often forgets second dose of Qvair.  Habits & Providers  Alcohol-Tobacco-Diet     Tobacco Status: quit     Year Quit: 2000  Current Medications (verified): 1)  Spiriva Handihaler 18 Mcg  Caps (Tiotropium Bromide Monohydrate) .... One Inhalation Daily 2)  Albuterol 90 Mcg/act  Aers (Albuterol) .... One To Two Puffs Q4h As Needed For Wheezing.  Disp One Mdi 3)  Nortriptyline Hcl 50 Mg  Caps (Nortriptyline Hcl) .... One Tablet By Mouth in The Morning and Two Tablets By Mouth Before Bedtime 4)  Omeprazole 20 Mg  Tbec (Omeprazole) .... One Tablet By Mouth Once Daily 5)  Zyrtec Allergy 10 Mg Tabs (Cetirizine Hcl) .... Take 1 Tablet By Mouth Once A Day 6)  Qvar 80 Mcg/act Aers (Beclomethasone Dipropionate) .... Two Puff Daily  Allergies (verified): 1)  * Tessalon Pearles  Social History: Smoking Status:  quit  Physical Exam  General:  Well-developed,well-nourished,in no acute distress; alert,appropriate and cooperative throughout examination Lungs:  normal respiratory effort, normal breath sounds, and no wheezes.   Heart:  normal rate and regular rhythm.     Impression & Recommendations:  Problem # 1:  COPD  (ICD-496)  Reinforced importance of proper use of inhalers Her updated medication list for this problem includes:    Spiriva Handihaler 18 Mcg Caps (Tiotropium bromide monohydrate) ..... One inhalation daily    Albuterol 90 Mcg/act Aers (Albuterol) ..... One to two puffs q4h as needed for wheezing.  disp one mdi    Qvar 80 Mcg/act Aers (Beclomethasone dipropionate) .Marland Kitchen..Marland Kitchen Two puff daily  Orders: [facility]- Est Level  2 (27253)  Complete Medication List: 1)  Spiriva Handihaler 18 Mcg Caps (Tiotropium bromide monohydrate) .... One inhalation daily 2)  Albuterol 90 Mcg/act Aers (Albuterol) .... One to two puffs q4h as needed for wheezing.  disp one mdi 3)  Nortriptyline Hcl 50 Mg Caps (Nortriptyline hcl) .... One tablet by mouth in the morning and two tablets by mouth before bedtime 4)  Omeprazole 20 Mg Tbec (Omeprazole) .... One tablet by mouth once daily 5)  Zyrtec Allergy 10 Mg Tabs (Cetirizine hcl) .... Take 1 tablet by mouth once a day 6)  Qvar 80 Mcg/act Aers (Beclomethasone dipropionate) .... Two puff daily  Patient Instructions: 1)  Please schedule a follow-up appointment in 2 months.

## 2010-04-06 IMAGING — CR DG CHEST 2V
2 series · 2 of 2 positions shown · non-contrast
Comparison: 08/09/2007

CLINICAL DATA: Shortness of breath and wheezing.  Recent
hospitalization  for pneumonia.  Ex-smoker.

CHEST - 2 VIEW

[view not recorded (1 of 2)]
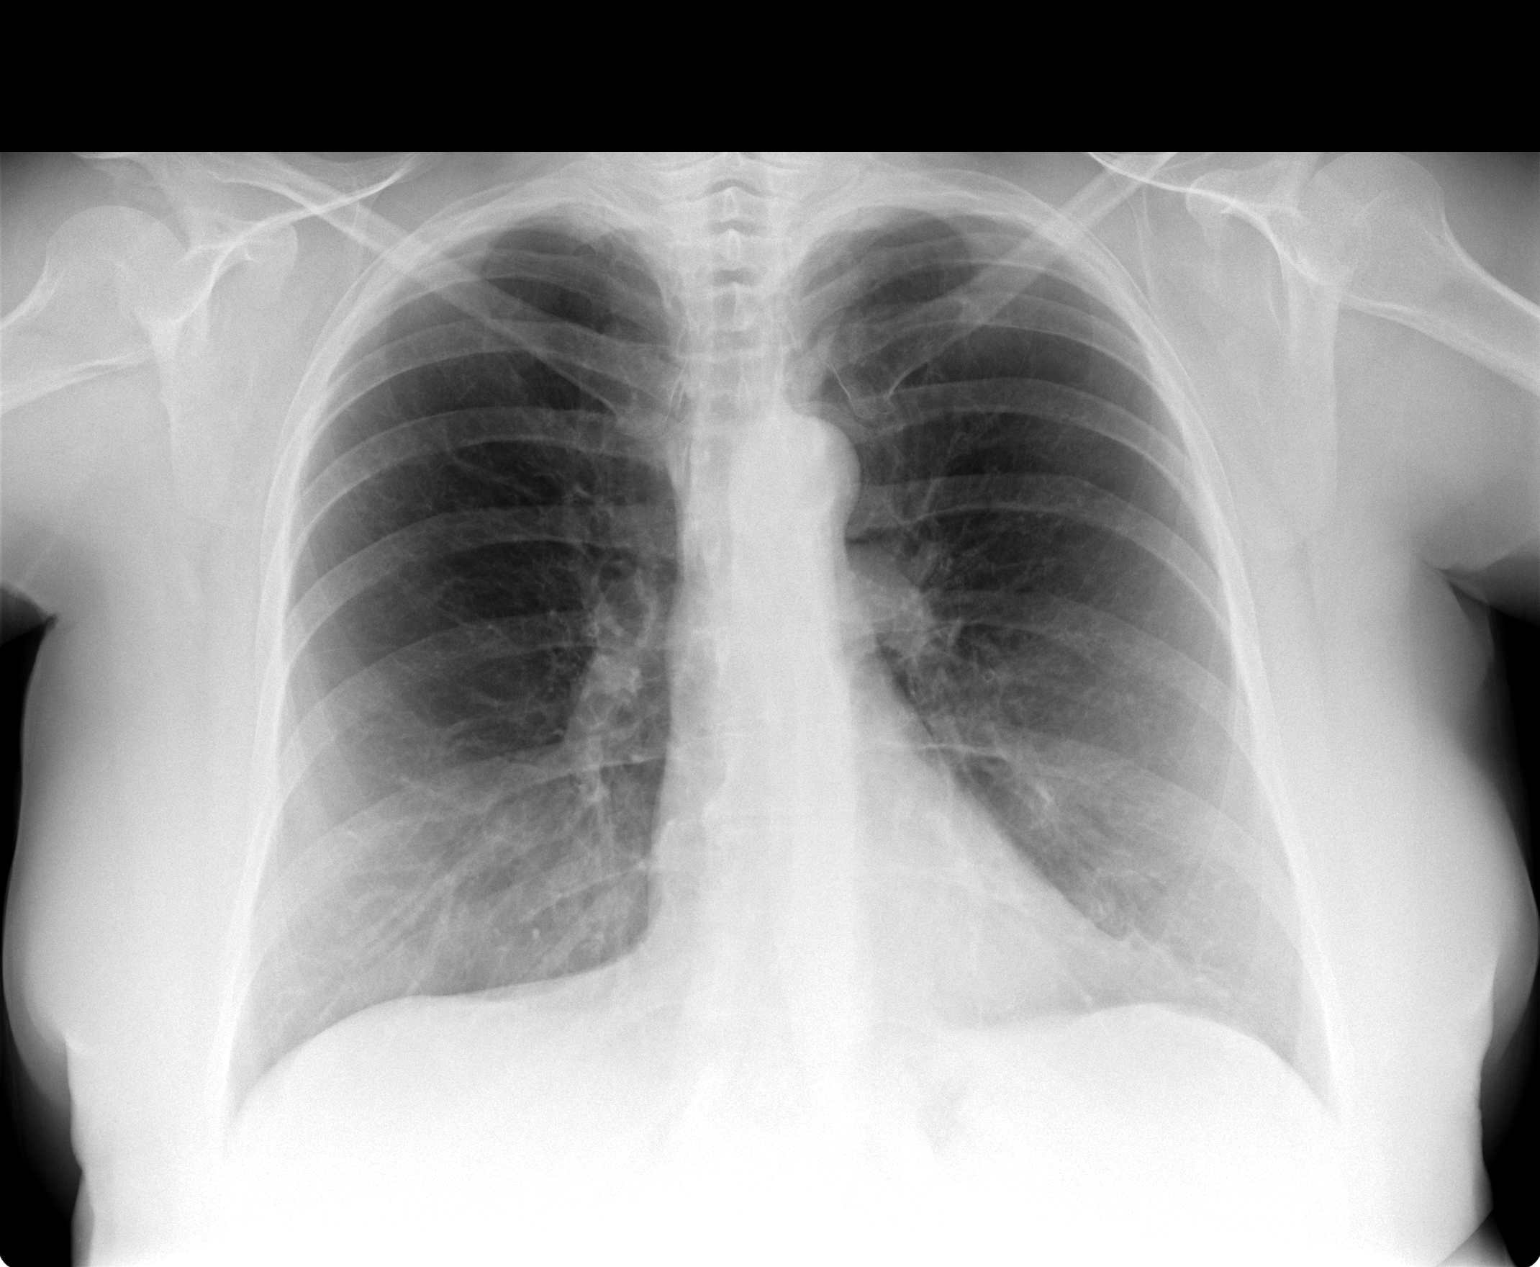

[view not recorded (2 of 2)]
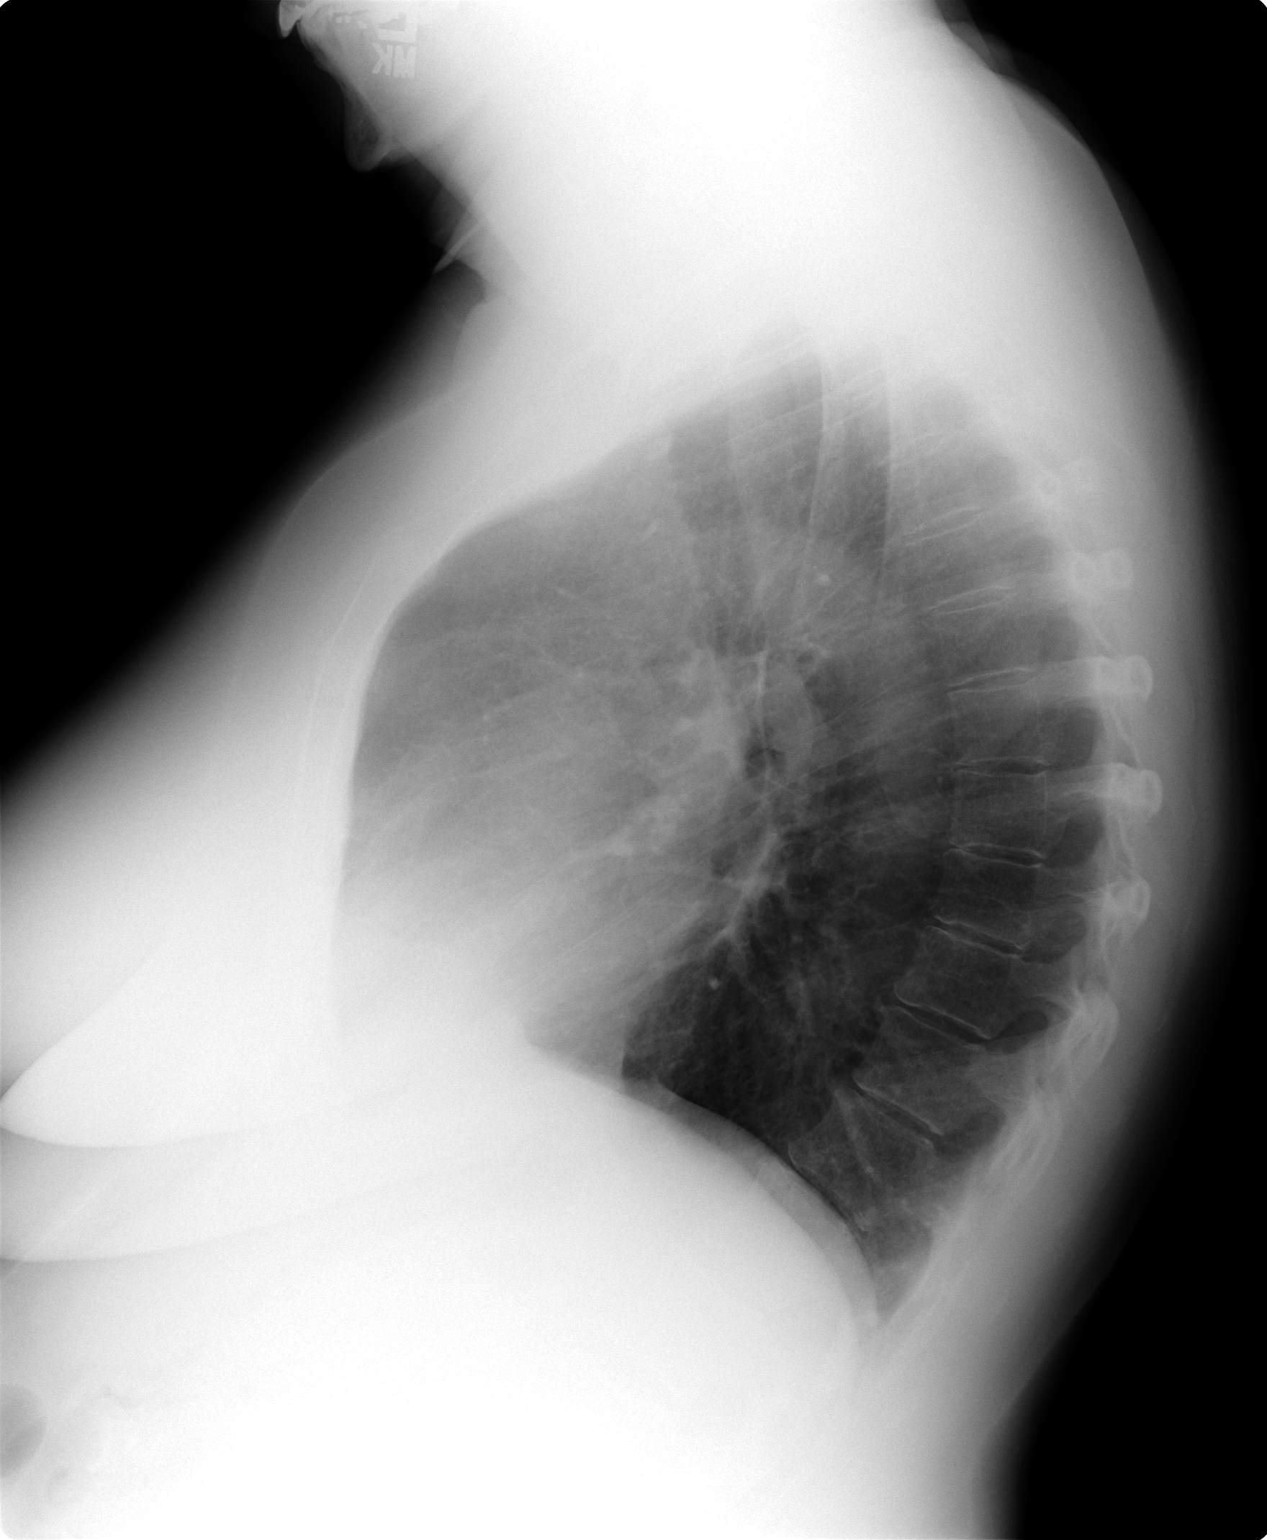

[2 of 2 positions shown; findings below may reference images not displayed]

FINDINGS: Hyperinflation suggests COPD. Midline trachea. Normal
heart size and mediastinal contours. No pleural effusion or
pneumothorax. Biapical pleural thickening.  Previously described
area of perihilar pneumonia is not appreciated.  A vague possible
area of increased density measuring approximately 9 mm projects
over the sixth anterior right rib on the frontal view.  Most likely
a summation shadow.

Interstitial thickening in the lingula is chronic.
IMPRESSION: 1. No acute cardiopulmonary disease.
2.  COPD with nonspecific lingular interstitial thickening as can
be seen with atypical mycobacterial infection (Mycobacterium avium
complex).
3.  Likely artifactual / summation density over the right mid lung
laterally.  Given the history of smoking, consider plain film
follow-up at 6 months to confirm stability.

## 2010-05-16 ENCOUNTER — Ambulatory Visit (INDEPENDENT_AMBULATORY_CARE_PROVIDER_SITE_OTHER): Payer: Medicaid Other | Admitting: Family Medicine

## 2010-05-16 ENCOUNTER — Encounter: Payer: Self-pay | Admitting: Family Medicine

## 2010-05-16 VITALS — BP 137/82 | HR 111 | Temp 99.0°F | Ht 59.75 in | Wt 194.0 lb

## 2010-05-16 DIAGNOSIS — J441 Chronic obstructive pulmonary disease with (acute) exacerbation: Secondary | ICD-10-CM

## 2010-05-16 MED ORDER — AEROCHAMBER MV MISC: Status: AC

## 2010-05-16 MED ORDER — PREDNISONE 50 MG PO TABS: 50.0000 mg | ORAL_TABLET | Freq: Every day | ORAL | Status: AC

## 2010-05-16 NOTE — Patient Instructions (Signed)
Thank you for coming in today. You have a COPD attack.  Use the albuterol with the spacer every 6 hours for the next few days.  Use your other inhalers.  Take the prednisone for 5 days.  Let me know if your breathing gets worse or you have a high fever or chills.  Come back if worse.

## 2010-05-16 NOTE — Progress Notes (Signed)
Samantha Coffey Noted increased cough with spitum production over the last three days. She has had to use her albuterol inhaler more (twice a day) for the last two days.  No fevers or chills. No dyspnea. Does note a wheeze. Feels overall kind of "sick". Thinks allergies is making this worse.   ROS: See above.   Exam:  Vs noted.  Gen: Well NAD HEENT: , MMM no pharynx inflimation Lungs: No increased WOB, slightly increased resting resp rate. Wheezing and prolonged exp phase noted BL. No crackles Heart: RRR no MRG

## 2010-05-16 NOTE — Assessment & Plan Note (Signed)
Excerebration. Doing Ok currently. No need for hospitalization at this time.  Plan: Prednisone burst + increased albuterol scheduled use. Added a spacer. Already on spiriva so I do not think that atrovent will help much. Gave red flags and COPD handout.  F/u PRN.

## 2010-05-21 ENCOUNTER — Ambulatory Visit: Payer: Medicaid Other | Admitting: Family Medicine

## 2010-05-27 LAB — CULTURE, ROUTINE-ABSCESS

## 2010-06-05 ENCOUNTER — Encounter: Payer: Self-pay | Admitting: Family Medicine

## 2010-06-05 ENCOUNTER — Ambulatory Visit (INDEPENDENT_AMBULATORY_CARE_PROVIDER_SITE_OTHER): Payer: Medicaid Other | Admitting: Family Medicine

## 2010-06-05 VITALS — BP 140/82 | HR 86 | Temp 97.9°F | Ht 60.0 in | Wt 202.0 lb

## 2010-06-05 DIAGNOSIS — R300 Dysuria: Secondary | ICD-10-CM

## 2010-06-05 DIAGNOSIS — N390 Urinary tract infection, site not specified: Secondary | ICD-10-CM | POA: Insufficient documentation

## 2010-06-05 LAB — POCT URINALYSIS DIPSTICK
Glucose, UA: NEGATIVE
Ketones, UA: NEGATIVE
Protein, UA: NEGATIVE
Spec Grav, UA: 1.025
pH, UA: 5.5

## 2010-06-05 LAB — POCT UA - MICROSCOPIC ONLY

## 2010-06-05 MED ORDER — CEPHALEXIN 500 MG PO CAPS: 500.0000 mg | ORAL_CAPSULE | Freq: Two times a day (BID) | ORAL | Status: AC

## 2010-06-05 MED ORDER — LORAZEPAM 0.5 MG PO TABS: 0.5000 mg | ORAL_TABLET | Freq: Two times a day (BID) | ORAL | Status: DC | PRN

## 2010-06-05 MED ORDER — NORTRIPTYLINE HCL 50 MG PO CAPS: ORAL_CAPSULE | ORAL | Status: DC

## 2010-06-07 NOTE — Progress Notes (Signed)
  Subjective:    Patient ID: Samantha Coffey, female    DOB: [birth year], 65 y.o.   MRN: [id number]  HPI  Urgency, frequency and dysuria.  No flank pain, fever or vomiting    Review of Systems     Objective:   Physical Exam Abd, benign, No CVA tenderness       Assessment & Plan:

## 2010-06-07 NOTE — Assessment & Plan Note (Signed)
Uncomplicated UTI.   

## 2010-06-25 NOTE — Discharge Summary (Signed)
Samantha Coffey, Samantha Coffey                ACCOUNT NO.:  [id number]   MEDICAL RECORD NO.:  [id number]          PATIENT TYPE:  INP   LOCATION:  5522                         FACILITY:  [facility]   PHYSICIAN:  Santiago Bumpers. Hensel, M.D.DATE OF BIRTH:  1945/04/24   DATE OF ADMISSION:  08/06/2007  DATE OF DISCHARGE:  08/10/2007                               DISCHARGE SUMMARY   ATTENDING PHYSICIAN:  Leighton Roach McDiarmid, MD   DISCHARGE DIAGNOSES:  1. Chronic obstructive pulmonary disease exacerbation.  2. History of chronic obstructive pulmonary disease.  3. Hypercholesterolemia.  4. Gastroesophageal reflux disease.  5. Depression.   DISCHARGE MEDICATIONS:  1. Prednisone 50 mg 1 p.o. daily for five more days.  2. Spiriva 18 mcg inhaled daily.  3. Albuterol 90 mcg 1-2 puffs q.4-6 h. p.r.n. wheeze.  4. Nortriptyline 50 mg in the morning 100 mg in the p.m.  5. Omeprazole 20 mg 1 p.o. daily.  6. Zyrtec 10 mg 1 p.o. daily.   FOLLOWUP:  The patient is to follow up with PCP Dr. Doralee Albino,  [facility] within the next 2 weeks appointment made.   ADMISSION LABS:  White blood cell 20.4, hemoglobin 12.7, hematocrit  37.5, platelets 324, neutrophils 85%, and ANC 17.3.  Sodium 132,  potassium 3.8, chloride 97, bicarb 27, glucose 111, BUN 6, creatinine  0.83, and calcium 8.6.   DISCHARGE LABS:  White blood cell 25, hemoglobin 12.2, and platelets  353.  Sodium 138, potassium 3.9, chloride 105, bicarb 29, glucose 132,  BUN 11 creatinine 0.79, and calcium 8.8.   IMAGING:  1. Initial chest x-ray on August 06, 2007, showing no acute CP process,      but mild-to-chronic left basilar atelectasis.  2. Repeat chest x-ray on August 09, 2007, showing COPD with subtle left      perihilar infiltrate, which could represent pneumonia, but overall      improvement in air space disease.   HOSPITAL COURSE:  For full summary please see dictated H and P.  In  short, this is a 65 year old female with history of COPD  who presents  with as an exacerbation.  1. Exacerbation/shortness of breath.  The patient presented with      shortness of breath, hypoxia to 80 on room air, low-grade fever,      and cough.  Given, she also had myalgias Swine Flu was considered      H1NI swab was sent, did not return prior to discharge.  The patient      treated for COPD exacerbation/pneumonia with Avelox for 5 days with      prednisone burst for 10 days and with albuterol/Atrovent nebs      scheduled.  The patient also started on Tamiflu in the ER and it      was decided to continue this during hospitalization.  Prior to      discharge, the patient was off of oxygen and satting between 90 and      96% on room air.  Of note, the patient states that even prior to      becoming sick,  she was using albuterol almost daily.  Home regimen      of COPD, Spiriva and albuterol p.r.n..  2. GERD, the patient was continued on omeprazole.  This was stable      throughout hospitalization.  3. Depression.  The patient was continued on home dose of      nortriptyline and she was stable throughout hospitalization.   ISSUES FOR FOLLOWUP:  1. Improvement of exacerbation with resolution of cough.  2. Leukocytosis considered secondary to steroids.      Eustaquio Boyden, MD  Electronically Signed      Santiago Bumpers. Leveda Anna, M.D.  Electronically Signed    JG/MEDQ  D:  08/10/2007  T:  08/10/2007  Job:  [id number]   cc:   William A. Leveda Anna, M.D.

## 2010-06-25 NOTE — H&P (Signed)
NAME:  Samantha Coffey, Samantha Coffey NO.:  [id number]   MEDICAL RECORD NO.:  [id number]          PATIENT TYPE:  INP   LOCATION:  5522                         FACILITY:  [facility]   PHYSICIAN:  Nestor Ramp, MD        DATE OF BIRTH:  01/30/1946   DATE OF ADMISSION:  08/06/2007  DATE OF DISCHARGE:                              HISTORY & PHYSICAL   CHIEF COMPLAINT:  Shortness of breath.   PRIMARY CARE PHYSICIAN:  Santiago Bumpers. Leveda Anna, MD,  [facility].   HISTORY OF PRESENT ILLNESS:  This is a 65 year old patient of Dr. Leveda Anna  who presents with shortness of breath, hypoxia, low-grade fever, and  cough.  She has a history of COPD as well.  The patient states that it  started 2 days prior to admission where she started to have a productive  cough with congestion.  She also had low-grade fevers to 100.3.  Next  day,  she, later on in the house for the entire day, was very fatigued  and had myalgias.  She also had decreased p.o. intake over the last 2  days.  Today, the patient had fever to 103 per ear measurement and  decided to go to urgent care center as clinic was closed.  She was seen  in urgent care where she was hypoxic to 88% on room air, so transferred  to ED for evaluation.  The patient states she has had COPD exacerbations  in the past, and they are similar to presentation.  No nausea or  vomiting.  No diarrhea or constipation.  No changes in urination,  although, the patient does note darker color urine and worse odor.  No  sick contacts.  The patient states she has had pain in her right chest  wall and shoulder with cough, pleuritic, which has resolved.  The  patient does have history of pneumothorax in the past.  The patient  denies sore throat or headache.   ALLERGIES:  None.   MEDICATIONS:  1. Spiriva 18 mcg capsules 1 inhalation daily.  2. Albuterol 19 mcg 1-2 puffs q.4 h. p.r.n. wheezing.  3. Nortriptyline 50 mg caps 1 in the morning and 2 in the  evening.  4. Omeprazole 20 mg 1 p.o. daily.  5. Zyrtec 10 mg 1 p.o. daily.   PAST MEDICAL HISTORY:  1. COPD.  2. History of pneumothorax.   PAST FAMILY HISTORY:  Negative for coronary artery disease or stroke.  Positive for cancer, diabetes, and high blood pressure.   SOCIAL HISTORY:  The patient quit smoking in 1998.  The patient states  she does drink alcohol about 6-8 beers on weekends, but none during the  week.  The patient denies recreational drugs.   PHYSICAL EXAMINATION:  VITAL SIGNS:  Temperature 100.1, pulse 100,  respiratory rate 20, blood pressure 113.75, O2 sat 88% on room air,  increased to 90s on 2 L.  GENERAL:  In no acute distress.  Well-developed, well-nourished, alert  and cooperative throughout exam.  HEENT:  Pupils equally, round, and reactive to light .  Extraocular  movements intact.  No inflammation or edema of conjunctiva.  Moist  mucous membrane.  No pharyngeal erythema or edema.  NECK:  Supple.  No lymphadenopathy.  CVS:  Normal S1 and S2.  No murmurs, rubs, or gallops.  LUNGS:  Inspiratory and expiratory wheezing throughout, crackles as  well.  Increased air movement.  Normal work of breathing.  ABDOMEN:  Bowel sounds positive.  Soft, nontender, and nondistended.  No  masses or splenomegaly noted.  PULSES:  2+ peripheral pulses.  EXTREMITIES:  No clubbing, cyanosis, or edema.  SKIN:  No rash or lesions.   IMAGING:  Chest x-ray initially showing no acute process but mild  chronic left basilar atelectasis.   LABORATORY DATA:  White blood cell  20.4, hemoglobin 12.7, hematocrit  37.5, platelet 324, neutrophil 85%, lymphocyte 8%, sodium 132, potassium  3.9,chloride 97, bicarb 27, glucose 111, BUN 6, creatinine 0.83, and  calcium 8.6.   In ED, the patient was on albuterol and Atrovent nebs x2 as well as Solu-  Medrol,  and Avelox.   ASSESSMENT AND PLAN:  This is a 65 year old patient with history of  chronic obstructive pulmonary disease who  presents with shortness of  breath, hypoxia, low-grade fever, and cough.  1. Dyspnea likely chronic obstructive pulmonary disease exacerbation      versus pneumonia versus swine flu.  We will admit to telemetry,      give predinsone burst for 10 days and will start Avelox.  Continue      albuterol, Atrovent nebs q.4 and q.2 p.r.n., supplemental oxygen      control to keep sats greater than 92%.  2. Chronic obstructive pulmonary disease.  Hold Spiriva while in      hospital.  Continue albuterol and Atrovent nebs.  3. Hypercholesterolemia.  Continue to monitor.  4. Reflux.  Proton pump inhibitor.  5. Depressive disorder.  Continue Nortriptyline home regimen.  6. Fluids, electrolytes, and nutrition.  Hep-Lock IV and healthy diet.  7. Disposition:  Monitor for clinical improvement.      Eustaquio Boyden, MD   Electronically Signed     ______________________________  Nestor Ramp, MD    JG/MEDQ  D:  08/06/2007  T:  08/07/2007  Job:  [id number]

## 2010-06-28 NOTE — Assessment & Plan Note (Signed)
[facility]                         GASTROENTEROLOGY OFFICE NOTE   JAMIA, HOBAN                       MRN:          [id number]  DATE:01/29/2006                            DOB:          1945/09/07    REASON FOR CONSULTATION:  Chronic reflux, family history of possible  colon cancer.   ASSESSMENT:  A 65 year old white woman whose brother was just diagnosed  with metastatic adenocarcinoma in the abdomen, site of origin not clear.  She has had one episode of rectal bleeding and she has chronic heartburn  problems not responsive to ranitidine.   PLAN:  1. Change ranitidine to omeprazole 20 mg daily, prescription sent to      [facility] by Dr. Tiajuana Amass.  2. Schedule colonoscopy.  3. She will need an upper endoscopy but it would be best to control      her heartburn symptoms to remove any inflammatory changes that      could confuse the picture with sorting out Barrett's esophagus etc.      We will schedule that once her symptoms are controlled.   Risks, benefits, and indications are explained. She understands and  agrees to proceed.   HISTORY:  This is a pleasant 65 year old white woman whose brother was  recently diagnosed with metastatic adenocarcinoma of unknown primary, it  is in the abdomen. She has had an episode of blood on the tissue paper  before in the last month or so. She has chronic heartburn symptoms,  wakes up at night with that. She has asthma but feels like that is under  control with her inhalers. There is no dysphagia, weight loss, or other  bleeding. She has never had an endoscopic evaluation.   MEDICATIONS:  1. Spiriva inhaler.  2. Flovent inhaler.  3. ProAir inhaler.  4. Nortriptyline 50 mg.  5. Ranitidine b.i.d. 150 mg.  6. Caltrate 600 plus D.  7. Tums.   DRUG ALLERGIES:  None known.   She uses some Goody's powder intermittently.   PAST MEDICAL HISTORY:  Asthma, depression, tubal ligation, appendectomy,  former smoker, rotator cuff strain, osteopenia, osteoarthritis, COPD,  history of Bell's palsy, dyslipidemia, gastroesophageal reflux disease,  blind in the right eye.   FAMILY HISTORY:  Cancer as above, another brother has had kidney cancer.  Diabetes, hypertension.   SOCIAL HISTORY:  She is drinking about two 6-packs a week, increased a  little bit lately because of the stress of her family. She is cautioned  about over doing that. She lives alone. She is disabled. She quit  smoking in 1999. One son, two daughters. She is divorced.   REVIEW OF SYSTEMS:  Otherwise the review of systems is reported as  negative.  See my medical history form.   PHYSICAL EXAMINATION:  Well-developed, obese white woman, no acute  distress.  Height 5 feet, weight 181 pounds, blood pressure 120/70, pulse 80.  EYES:  Anicteric.  ENT: Posterior pharynx free of lesions.  NECK: Supple, no thyromegaly, or masses.  CHEST: Clear.  HEART: S1, S2, no murmurs, rubs, or gallops.  ABDOMEN: Obese, soft nontender, no organomegaly  or mass.  RECTAL EXAM: Deferred.  EXTREMITIES: Trace peripheral edema bilaterally.  LYMPHATIC: No neck or inguinal nodes.  SKIN: Warm and dry, no acute rash.  NEURO/PSYCH: She is alert and oriented x3.   I have reviewed Dr. Cyndia Skeeters problem list and consultation request note.   I appreciate the opportunity to care for this patient.     Iva Boop, MD,FACG  Electronically Signed    CEG/MedQ  DD: 01/29/2006  DT: 01/29/2006  Job #: [id number]   cc:   William A. Leveda Anna, M.D.

## 2010-06-28 NOTE — H&P (Signed)
Samantha Coffey, Samantha Coffey                ACCOUNT NO.:  [id number]   MEDICAL RECORD NO.:  [id number]          PATIENT TYPE:  INP   LOCATION:  1610                         FACILITY:  [facility]   PHYSICIAN:  Vikki Ports, MDDATE OF BIRTH:  Oct 14, 1945   DATE OF ADMISSION:  10/26/2004  DATE OF DISCHARGE:                                HISTORY & PHYSICAL   ADMISSION DIAGNOSIS:  Acute appendicitis.   HISTORY OF PRESENT ILLNESS:  The patient is a 65 year old female who  presented with an acute onset of periumbilical and then right lower quadrant  abdominal pain.  She was seen in the emergency room and evaluated by Dr. Linwood Dibbles.  CT scan was consistent with acute appendicitis and her white count  was 17,000.  Patient was complaining of some nausea and had some emesis at  the time of the CAT scan.   PAST MEDICAL HISTORY:  None.   PAST SURGICAL HISTORY:  None.   MEDICATIONS:  None.   ALLERGIES:  Allergies are to CODEINE which causes nausea.   PHYSICAL EXAMINATION:  She is an age-appropriate white female in moderate  distress.  HEENT exam is benign.  Normocephalic, atraumatic.  Right eye has  a cataract and has some mild proptosis.  The lungs are clear to auscultation  and percussion x2.  Heart is regular rate and rhythm without murmurs, rubs,  or gallops.  Abdomen is moderately obese and tender in the right lower  quadrant to moderate palpation with positive rebound.  Extremities show no  clubbing, cyanosis, or edema.   IMPRESSION:  Acute appendicitis.   PLAN:  Laparoscopic appendectomy with IV antibiotics preoperatively.      Vikki Ports, MD  Electronically Signed     KRH/MEDQ  D:  10/27/2004  T:  10/27/2004  Job:  [id number]

## 2010-06-28 NOTE — Op Note (Signed)
Samantha Coffey, Samantha Coffey                ACCOUNT NO.:  [id number]   MEDICAL RECORD NO.:  [id number]          PATIENT TYPE:  INP   LOCATION:  1610                         [facility]AC[facility]   PHYSICIAN:  Vikki Ports, MDDATE OF BIRTH:  [birth year]   DATE OF PROCEDURE:  10/27/2004  DATE OF DISCHARGE:                                 OPERATIVE REPORT   PREOPERATIVE DIAGNOSIS:  Acute appendicitis.   POSTOPERATIVE DIAGNOSIS:  Acute appendicitis.   PROCEDURE:  Laparoscopic appendectomy.   SURGEON:  Dr. Danna Hefty   ASSISTANT:  None.   ANESTHESIA:  General.   DESCRIPTION OF PROCEDURE:  The patient was taken to the operating room,  placed in a supine position.  After general anesthesia was induced using  endotracheal tube, Foley catheter was placed and the abdomen was prepped and  draped in normal sterile fashion.  Using a 12 mm OptiVu in the left upper  quadrant, peritoneal access was obtained, and pneumoperitoneum was obtained.  Under direct visualization, 12 and 5 mm ports were placed in the left  abdomen.  There was significant amount of inflammation in the right lower  quadrant but no evidence of abscess.  Omental peel was removed off the  appendix.  The appendix was retracted anteriorly.  Mesoappendix was taken  down with the harmonic scalpel, and the base of the acutely inflamed  appendix was transected using a white load GIA stapling device.  It was  placed in an EndoCatch bag and removed through the left upper quadrant port.  Adequate hemostasis was ensured.  The right lower quadrant was copiously  irrigated.  Pneumoperitoneum was released; trocars were removed.  Skin  incisions were closed with subcuticular 4-0 Monocryl.  Steri-Strips, sterile  dressings were applied.  The patient tolerated the procedure well and went  to PACU in good condition.      Vikki Ports, MD  Electronically Signed     KRH/MEDQ  D:  10/27/2004  T:  10/28/2004  Job:  [id number]

## 2010-08-14 ENCOUNTER — Emergency Department (HOSPITAL_BASED_OUTPATIENT_CLINIC_OR_DEPARTMENT_OTHER)
Admission: EM | Admit: 2010-08-14 | Discharge: 2010-08-14 | Disposition: A | Discharge status: Home / Self Care | Payer: Medicaid Other | Attending: Emergency Medicine | Admitting: Emergency Medicine

## 2010-08-14 DIAGNOSIS — M5380 Other specified dorsopathies, site unspecified: Secondary | ICD-10-CM | POA: Insufficient documentation

## 2010-08-14 DIAGNOSIS — J449 Chronic obstructive pulmonary disease, unspecified: Secondary | ICD-10-CM | POA: Insufficient documentation

## 2010-08-14 DIAGNOSIS — J4489 Other specified chronic obstructive pulmonary disease: Secondary | ICD-10-CM | POA: Insufficient documentation

## 2010-08-17 ENCOUNTER — Other Ambulatory Visit: Payer: Self-pay | Admitting: Family Medicine

## 2010-08-18 NOTE — Telephone Encounter (Signed)
Refill request

## 2010-10-30 ENCOUNTER — Ambulatory Visit (INDEPENDENT_AMBULATORY_CARE_PROVIDER_SITE_OTHER): Payer: Medicaid Other | Admitting: *Deleted

## 2010-10-30 DIAGNOSIS — Z23 Encounter for immunization: Secondary | ICD-10-CM

## 2010-11-03 ENCOUNTER — Other Ambulatory Visit: Payer: Self-pay | Admitting: Family Medicine

## 2010-11-03 NOTE — Telephone Encounter (Signed)
Refill request

## 2010-11-07 LAB — DIFFERENTIAL
Basophils Absolute: 0.2 — ABNORMAL HIGH
Basophils Relative: 1
Eosinophils Absolute: 0.2
Eosinophils Relative: 1
Lymphocytes Relative: 8 — ABNORMAL LOW
Lymphs Abs: 1.5
Monocytes Absolute: 1.2 — ABNORMAL HIGH
Monocytes Relative: 6
Neutro Abs: 17.3 — ABNORMAL HIGH
Neutrophils Relative %: 85 — ABNORMAL HIGH

## 2010-11-07 LAB — CBC
HCT: 36.1
HCT: 37.5
HCT: 37.5
Hemoglobin: 12.2
Hemoglobin: 12.6
Hemoglobin: 12.7
MCHC: 33.6
MCHC: 34
MCV: 92.6
MCV: 93.2
MCV: 93.2
Platelets: 316
Platelets: 324
Platelets: 353
RBC: 4.03
RBC: 4.04
RDW: 13.4
RDW: 13.5
WBC: 19.1 — ABNORMAL HIGH
WBC: 20.4 — ABNORMAL HIGH
WBC: 25.7 — ABNORMAL HIGH

## 2010-11-07 LAB — BASIC METABOLIC PANEL
BUN: 11
Calcium: 8.6
Chloride: 105
GFR calc Af Amer: >60
GFR calc non Af Amer: >60
GFR calc non Af Amer: >60
Glucose, Bld: 132 — ABNORMAL HIGH
Potassium: 3.8
Potassium: 3.9
Sodium: 132 — ABNORMAL LOW
Sodium: 138

## 2010-11-09 ENCOUNTER — Other Ambulatory Visit: Payer: Self-pay | Admitting: Family Medicine

## 2010-11-09 NOTE — Telephone Encounter (Signed)
Refill request

## 2010-11-17 ENCOUNTER — Other Ambulatory Visit: Payer: Self-pay | Admitting: Family Medicine

## 2010-11-17 NOTE — Telephone Encounter (Signed)
Refill request

## 2010-12-06 ENCOUNTER — Other Ambulatory Visit: Payer: Self-pay | Admitting: Family Medicine

## 2010-12-06 DIAGNOSIS — F329 Major depressive disorder, single episode, unspecified: Secondary | ICD-10-CM

## 2010-12-06 MED ORDER — LORAZEPAM 0.5 MG PO TABS: 0.5000 mg | ORAL_TABLET | Freq: Two times a day (BID) | ORAL | Status: DC | PRN

## 2010-12-06 NOTE — Assessment & Plan Note (Signed)
Rx called in after fax request.

## 2010-12-06 NOTE — Telephone Encounter (Signed)
Refill request

## 2010-12-20 ENCOUNTER — Other Ambulatory Visit: Payer: Self-pay | Admitting: Family Medicine

## 2010-12-20 DIAGNOSIS — Z1231 Encounter for screening mammogram for malignant neoplasm of breast: Secondary | ICD-10-CM

## 2011-01-14 ENCOUNTER — Ambulatory Visit: Payer: Medicaid Other

## 2011-01-15 ENCOUNTER — Other Ambulatory Visit: Payer: Self-pay | Admitting: Family Medicine

## 2011-01-15 MED ORDER — OMEPRAZOLE 20 MG PO CPDR: 20.0000 mg | DELAYED_RELEASE_CAPSULE | Freq: Every day | ORAL | Status: DC

## 2011-01-15 NOTE — Telephone Encounter (Signed)
Refill request

## 2011-01-20 ENCOUNTER — Ambulatory Visit
Admission: RE | Admit: 2011-01-20 | Discharge: 2011-01-20 | Disposition: A | Discharge status: Home / Self Care | Payer: Medicaid Other | Source: Ambulatory Visit | Attending: Family Medicine | Admitting: Family Medicine

## 2011-01-20 DIAGNOSIS — Z1231 Encounter for screening mammogram for malignant neoplasm of breast: Secondary | ICD-10-CM

## 2011-03-07 ENCOUNTER — Other Ambulatory Visit: Payer: Self-pay | Admitting: Family Medicine

## 2011-03-07 NOTE — Telephone Encounter (Signed)
Received fax requesting refill of lorazepam.  Seemed early so I called pharm.  She is NOT misusing the med.  When she called for refill, she gave an old prescription number.  She still has all five refills from the 12/06/10 Rx.

## 2011-03-11 ENCOUNTER — Encounter: Payer: Self-pay | Admitting: Internal Medicine

## 2011-03-14 ENCOUNTER — Other Ambulatory Visit: Payer: Self-pay | Admitting: Family Medicine

## 2011-03-14 NOTE — Telephone Encounter (Signed)
Refill request

## 2011-03-21 ENCOUNTER — Encounter: Payer: Self-pay | Admitting: Family Medicine

## 2011-03-21 ENCOUNTER — Ambulatory Visit (INDEPENDENT_AMBULATORY_CARE_PROVIDER_SITE_OTHER): Payer: Medicaid Other | Admitting: Family Medicine

## 2011-03-21 DIAGNOSIS — F329 Major depressive disorder, single episode, unspecified: Secondary | ICD-10-CM

## 2011-03-21 DIAGNOSIS — J449 Chronic obstructive pulmonary disease, unspecified: Secondary | ICD-10-CM

## 2011-03-21 DIAGNOSIS — R42 Dizziness and giddiness: Secondary | ICD-10-CM | POA: Insufficient documentation
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
## 2011-03-21 NOTE — Assessment & Plan Note (Signed)
Patient is under enormous stress and coping pretty well

## 2011-03-21 NOTE — Patient Instructions (Signed)
Please keep track of the lightheaded spells and see what pattern you can find.  What are you doing at the time.  When was the last time you ate, etc. See me in three months with your notes -sooner if the spells are getting worse. You are due for a pap smear - probably your last one ever.  See me some time when you can leave your mom at home.

## 2011-03-21 NOTE — Assessment & Plan Note (Signed)
Stable on current meds 

## 2011-04-19 ENCOUNTER — Other Ambulatory Visit: Payer: Self-pay | Admitting: Family Medicine

## 2011-04-20 NOTE — Telephone Encounter (Signed)
Refill request

## 2011-05-16 ENCOUNTER — Other Ambulatory Visit: Payer: Self-pay | Admitting: Family Medicine

## 2011-06-15 ENCOUNTER — Other Ambulatory Visit: Payer: Self-pay | Admitting: Family Medicine

## 2011-09-03 ENCOUNTER — Other Ambulatory Visit: Payer: Self-pay | Admitting: Family Medicine

## 2011-09-03 DIAGNOSIS — F329 Major depressive disorder, single episode, unspecified: Secondary | ICD-10-CM

## 2011-09-03 MED ORDER — LORAZEPAM 0.5 MG PO TABS: 0.5000 mg | ORAL_TABLET | Freq: Two times a day (BID) | ORAL | Status: DC | PRN

## 2011-09-03 NOTE — Telephone Encounter (Signed)
Refilled based on fax request 

## 2011-09-29 IMAGING — CR DG CHEST 2V
2 series · 2 of 2 positions shown · non-contrast
Comparison: 09/15/2007.

CLINICAL DATA: COPD.  Cough.  Shortness of breath.

CHEST - 2 VIEW

[w chest pa]
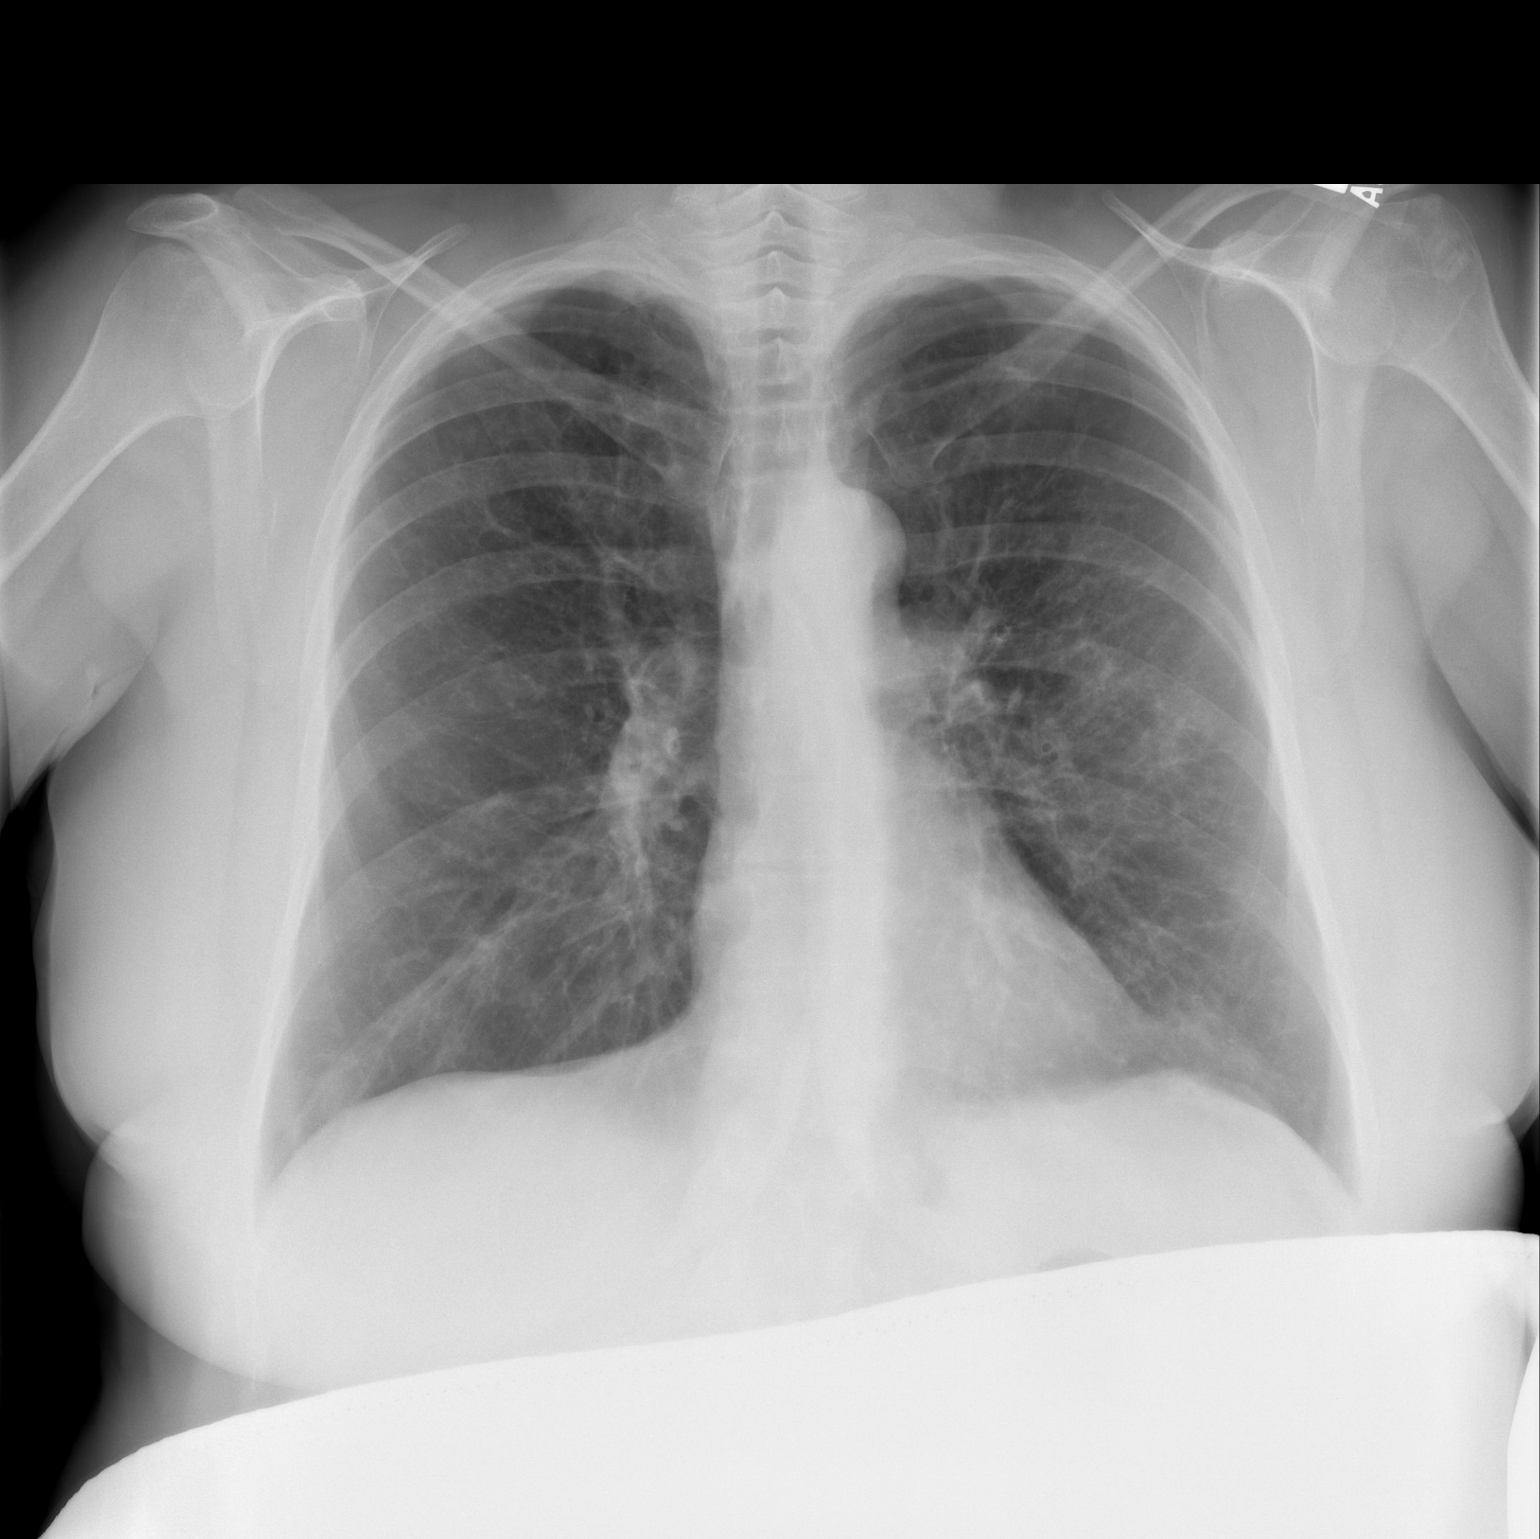

[w chest lat]
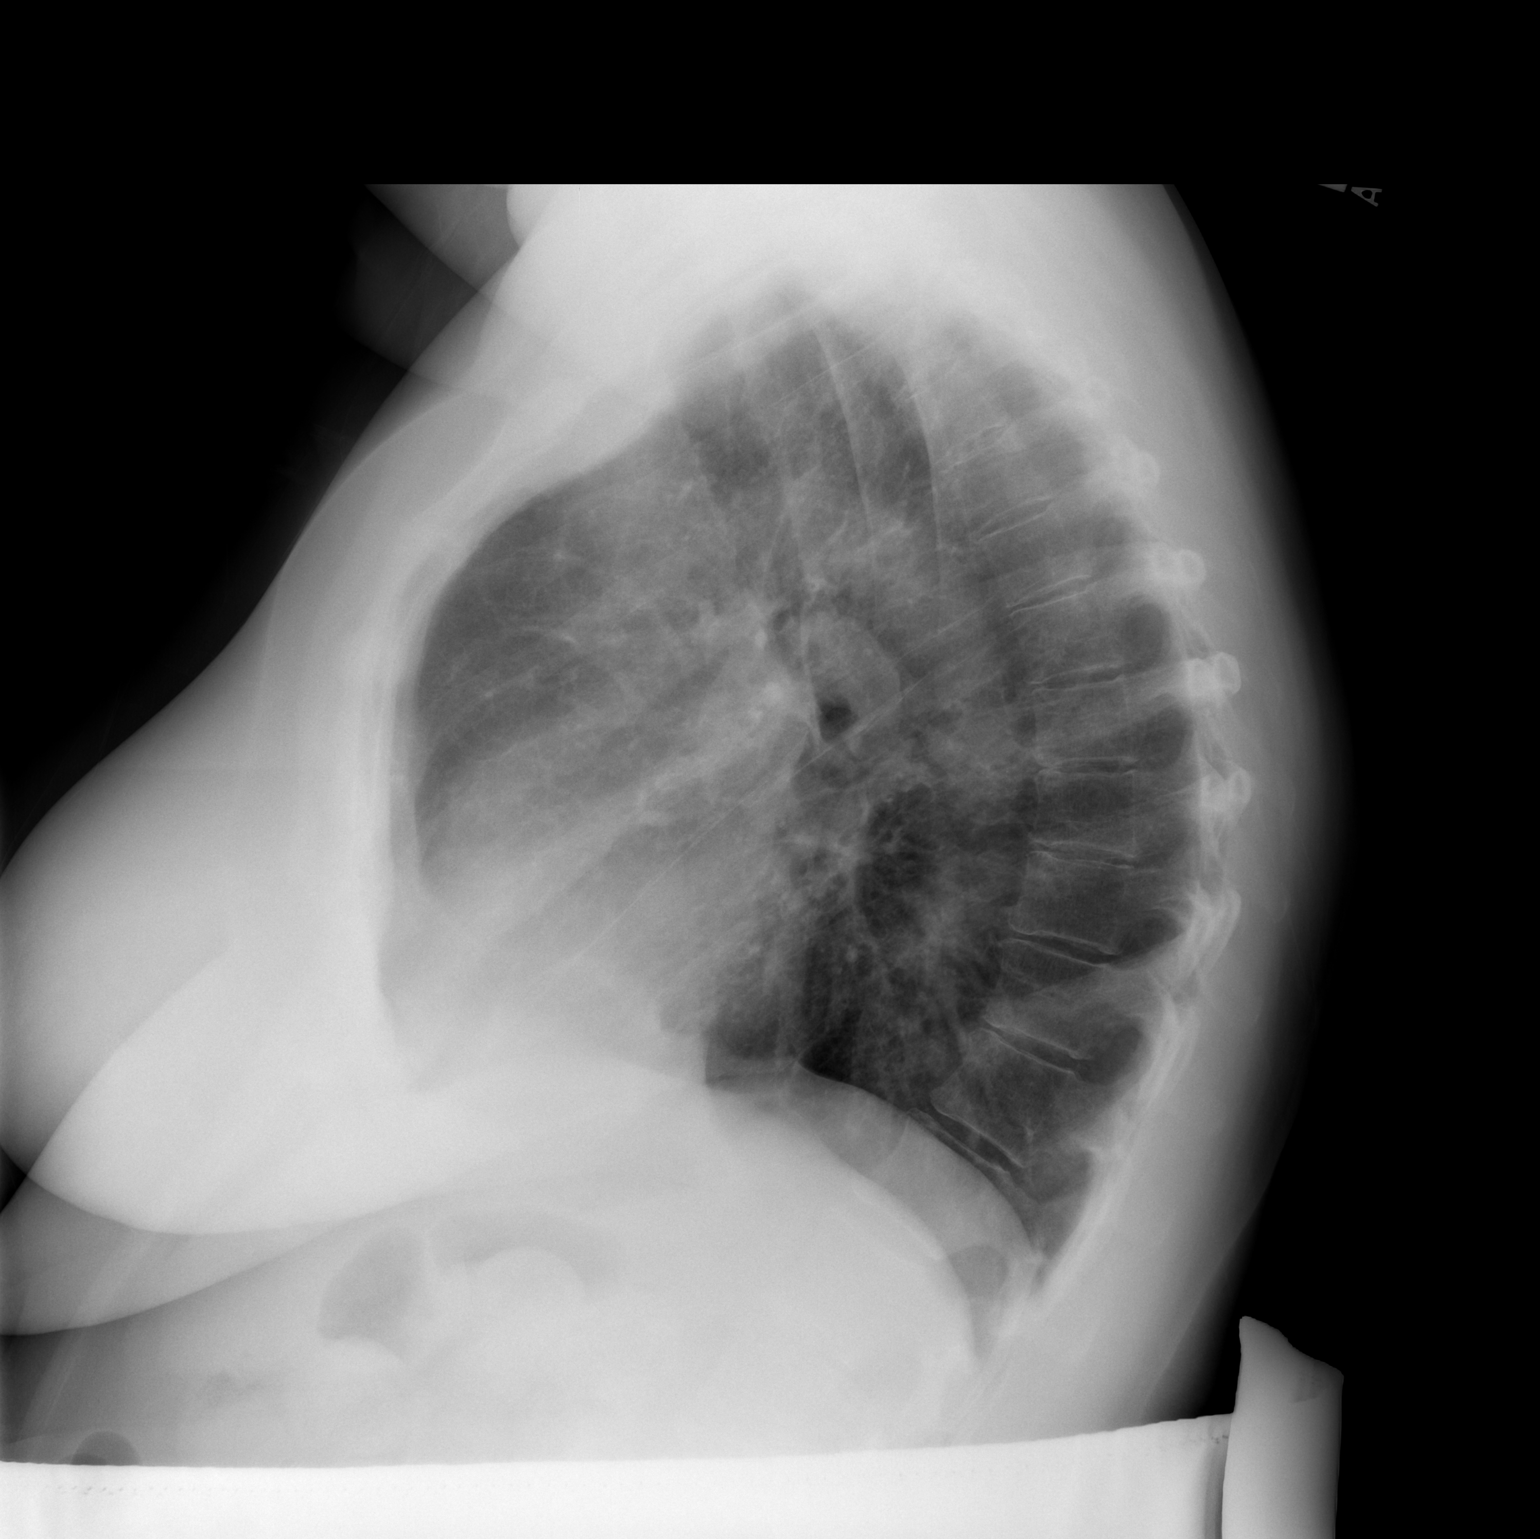

[2 of 2 positions shown; findings below may reference images not displayed]

FINDINGS: Two-view exam shows focal airspace disease in the left
midlung, consistent with pneumonia.  Right lung is clear.
Underlying chronic interstitial changes are stable.  Scarring at
the left lung base is stable. The cardiopericardial silhouette is
within normal limits for size. Imaged bony structures of the thorax
are intact.
IMPRESSION: Focal airspace disease in the left midlung, consistent with
pneumonia.

## 2011-11-14 ENCOUNTER — Encounter: Payer: Self-pay | Admitting: Gastroenterology

## 2011-11-20 ENCOUNTER — Other Ambulatory Visit: Payer: Self-pay | Admitting: Family Medicine

## 2011-11-21 ENCOUNTER — Encounter: Payer: Self-pay | Admitting: Family Medicine

## 2011-11-21 ENCOUNTER — Ambulatory Visit (INDEPENDENT_AMBULATORY_CARE_PROVIDER_SITE_OTHER): Payer: Medicaid Other | Admitting: Family Medicine

## 2011-11-21 VITALS — BP 165/95 | HR 106 | Temp 99.1°F | Wt 192.0 lb

## 2011-11-21 DIAGNOSIS — F329 Major depressive disorder, single episode, unspecified: Secondary | ICD-10-CM

## 2011-11-21 DIAGNOSIS — Z23 Encounter for immunization: Secondary | ICD-10-CM

## 2011-11-21 DIAGNOSIS — I10 Essential (primary) hypertension: Secondary | ICD-10-CM

## 2011-11-21 DIAGNOSIS — J449 Chronic obstructive pulmonary disease, unspecified: Secondary | ICD-10-CM

## 2011-11-21 DIAGNOSIS — E7800 Pure hypercholesterolemia, unspecified: Secondary | ICD-10-CM

## 2011-11-21 LAB — CBC
HCT: 41.3 % (ref 36.0–46.0)
Hemoglobin: 14.2 g/dL (ref 12.0–15.0)
MCH: 30.9 pg (ref 26.0–34.0)
MCHC: 34.4 g/dL (ref 30.0–36.0)
RDW: 13.2 % (ref 11.5–15.5)

## 2011-11-21 LAB — COMPLETE METABOLIC PANEL WITH GFR
AST: 21 U/L (ref 0–37)
Alkaline Phosphatase: 104 U/L (ref 39–117)
BUN: 11 mg/dL (ref 6–23)
Creat: 0.73 mg/dL (ref 0.50–1.10)
GFR, Est Non African American: 88 mL/min
Glucose, Bld: 111 mg/dL — ABNORMAL HIGH (ref 70–99)
Potassium: 4.6 mEq/L (ref 3.5–5.3)
Total Bilirubin: 0.4 mg/dL (ref 0.3–1.2)

## 2011-11-21 LAB — LIPID PANEL
Cholesterol: 265 mg/dL — ABNORMAL HIGH (ref 0–200)
HDL: 63 mg/dL (ref >39)
Total CHOL/HDL Ratio: 4.2 Ratio
Triglycerides: 182 mg/dL — ABNORMAL HIGH (ref <150)
VLDL: 36 mg/dL (ref 0–40)

## 2011-11-21 MED ORDER — HYDROCHLOROTHIAZIDE 12.5 MG PO TABS: 12.5000 mg | ORAL_TABLET | Freq: Every day | ORAL | Status: DC

## 2011-11-21 MED ORDER — CETIRIZINE HCL 10 MG PO TABS: 10.0000 mg | ORAL_TABLET | Freq: Every day | ORAL | Status: DC

## 2011-11-21 MED ORDER — FLUTICASONE-SALMETEROL 250-50 MCG/DOSE IN AEPB: 1.0000 | INHALATION_SPRAY | Freq: Two times a day (BID) | RESPIRATORY_TRACT | Status: DC

## 2011-11-21 NOTE — Patient Instructions (Addendum)
Blood pressure is up today.  I started a new medicine.  You need to see me in 2-3 weeks for your last ever pap smear and BP recheck. You got a flu shot today. I refilled your spiriva and advair. Watch the salt in your diet.  Salt runs your blood pressure up. I will call with blood work results. Consider calling [facility] for grief counseling. Your last mammogram was 01/21/11

## 2011-11-21 NOTE — Assessment & Plan Note (Signed)
BP confirmed, will start meds and recheck in 2-3 weeks.
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
## 2011-11-24 ENCOUNTER — Encounter: Payer: Self-pay | Admitting: Family Medicine

## 2011-11-24 DIAGNOSIS — E7800 Pure hypercholesterolemia, unspecified: Secondary | ICD-10-CM | POA: Insufficient documentation

## 2011-11-24 MED ORDER — SIMVASTATIN 20 MG PO TABS: 20.0000 mg | ORAL_TABLET | Freq: Every day | ORAL | Status: DC

## 2011-11-24 NOTE — Assessment & Plan Note (Signed)
Grief worsening depression.  Rec counseling.

## 2011-11-24 NOTE — Assessment & Plan Note (Signed)
Labs now back.  Start statin.

## 2011-12-16 ENCOUNTER — Other Ambulatory Visit: Payer: Self-pay | Admitting: Family Medicine

## 2012-01-01 ENCOUNTER — Telehealth: Payer: Self-pay | Admitting: Family Medicine

## 2012-01-01 NOTE — Telephone Encounter (Signed)
Is asking to speak w/ Dr Leveda Anna about her son and getting him help with his drinking - she is upset that he keeps checking himself out of places that try to help him.

## 2012-01-02 NOTE — Telephone Encounter (Signed)
Dr. Leveda Anna,  I spoke with Samantha Coffey and she is very concerned about her son and crying. She states that her son was in the ED for his drinking recently and was also in jail and was hoping that he would have been kept longer so he detoxed. She said she believed his alcohol level was in the 380's. I told her that it is very hard to keep an adult in places because if he does not want to be kept he can just sign himself out or leave. She does understand that. When I asked if he has been a harm to himself or if he has mentioned any suicidal thoughts she stated that he has mentioned to her and his sister that he wants to and has tried to drink himself to death. He states that he does want to die. I am routing this to you to let you know about this and also she is wondering if there is anything else that you may know we can do for him.

## 2012-01-02 NOTE — Telephone Encounter (Signed)
Called and discussed.  I recommended that she attend an al anon meeting.

## 2012-01-13 ENCOUNTER — Other Ambulatory Visit: Payer: Self-pay | Admitting: Family Medicine

## 2012-01-13 DIAGNOSIS — Z1231 Encounter for screening mammogram for malignant neoplasm of breast: Secondary | ICD-10-CM

## 2012-01-14 ENCOUNTER — Encounter: Payer: Medicaid Other | Admitting: Family Medicine

## 2012-01-23 ENCOUNTER — Ambulatory Visit (INDEPENDENT_AMBULATORY_CARE_PROVIDER_SITE_OTHER): Payer: Medicaid Other | Admitting: Family Medicine

## 2012-01-23 ENCOUNTER — Encounter: Payer: Self-pay | Admitting: Family Medicine

## 2012-01-23 ENCOUNTER — Other Ambulatory Visit (HOSPITAL_COMMUNITY)
Admission: RE | Admit: 2012-01-23 | Discharge: 2012-01-23 | Disposition: A | Discharge status: Home / Self Care | Payer: Medicaid Other | Source: Ambulatory Visit | Attending: Family Medicine | Admitting: Family Medicine

## 2012-01-23 VITALS — BP 140/79 | HR 102 | Temp 98.1°F | Ht 60.0 in | Wt 191.0 lb

## 2012-01-23 DIAGNOSIS — Z01419 Encounter for gynecological examination (general) (routine) without abnormal findings: Secondary | ICD-10-CM | POA: Insufficient documentation

## 2012-01-23 DIAGNOSIS — I10 Essential (primary) hypertension: Secondary | ICD-10-CM

## 2012-01-23 DIAGNOSIS — N3946 Mixed incontinence: Secondary | ICD-10-CM | POA: Insufficient documentation

## 2012-01-23 DIAGNOSIS — F329 Major depressive disorder, single episode, unspecified: Secondary | ICD-10-CM

## 2012-01-23 DIAGNOSIS — Z124 Encounter for screening for malignant neoplasm of cervix: Secondary | ICD-10-CM

## 2012-01-23 DIAGNOSIS — Z0000 Encounter for general adult medical examination without abnormal findings: Secondary | ICD-10-CM

## 2012-01-23 DIAGNOSIS — E7800 Pure hypercholesterolemia, unspecified: Secondary | ICD-10-CM

## 2012-01-23 NOTE — Assessment & Plan Note (Signed)
Will start with Kegels.  May add detrol later.
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
## 2012-01-23 NOTE — Assessment & Plan Note (Signed)
Not good due to situation, but managing.

## 2012-01-23 NOTE — Assessment & Plan Note (Signed)
Improved

## 2012-01-23 NOTE — Assessment & Plan Note (Signed)
Recheck d LDL on statin. 

## 2012-01-23 NOTE — Assessment & Plan Note (Signed)
Main HPDP issue is obesity.  Also has other med problems.

## 2012-01-23 NOTE — Patient Instructions (Signed)
I will call with your cholesterol and Pap smear results Do get your mammogram Call when you are ready to move for med refills. Let me know if it is worth taking a medication for your bladder problems.

## 2012-01-26 ENCOUNTER — Encounter: Payer: Self-pay | Admitting: Family Medicine

## 2012-02-19 ENCOUNTER — Ambulatory Visit: Payer: Medicaid Other

## 2012-03-04 ENCOUNTER — Other Ambulatory Visit: Payer: Self-pay | Admitting: Family Medicine

## 2012-03-05 ENCOUNTER — Other Ambulatory Visit: Payer: Self-pay | Admitting: *Deleted

## 2012-03-05 NOTE — Telephone Encounter (Signed)
Dear [facility] A[facility] Team Please call in one month refill as below [facility]! Denny Levy

## 2012-03-05 NOTE — Telephone Encounter (Signed)
LORazepam (ATIVAN) 0.5 MG tablet  60 tablet  0  03/04/2012     Sig: TAKE 1 TABLET BY MOUTH TWICE A DAY AS NEEDED FOR ANXIETY   Class: Phone In   DAW: No   Authorizing Provider: Nestor Ramp, MD         Denny Levy, MD 03/05/2012 10:03 AM Signed  Dear [facility] Team  Please call in one month refill as below  [facility]!  Denny Levy      Spoke with patient and informed her of below

## 2012-03-12 ENCOUNTER — Ambulatory Visit
Admission: RE | Admit: 2012-03-12 | Discharge: 2012-03-12 | Disposition: A | Discharge status: Home / Self Care | Payer: Medicaid Other | Source: Ambulatory Visit | Attending: Family Medicine | Admitting: Family Medicine

## 2012-03-12 DIAGNOSIS — Z1231 Encounter for screening mammogram for malignant neoplasm of breast: Secondary | ICD-10-CM

## 2012-04-06 ENCOUNTER — Other Ambulatory Visit: Payer: Self-pay | Admitting: Family Medicine

## 2012-04-20 ENCOUNTER — Other Ambulatory Visit: Payer: Self-pay | Admitting: Family Medicine

## 2012-04-22 ENCOUNTER — Other Ambulatory Visit: Payer: Self-pay | Admitting: Family Medicine

## 2012-07-08 ENCOUNTER — Other Ambulatory Visit: Payer: Self-pay | Admitting: Family Medicine

## 2012-07-18 ENCOUNTER — Emergency Department (HOSPITAL_COMMUNITY)
Admission: EM | Admit: 2012-07-18 | Discharge: 2012-07-18 | Disposition: A | Discharge status: Home / Self Care | Payer: Medicaid Other | Source: Home / Self Care | Attending: Emergency Medicine | Admitting: Emergency Medicine

## 2012-07-18 ENCOUNTER — Encounter (HOSPITAL_COMMUNITY): Payer: Self-pay

## 2012-07-18 DIAGNOSIS — K047 Periapical abscess without sinus: Secondary | ICD-10-CM

## 2012-07-18 MED ORDER — HYDROCODONE-ACETAMINOPHEN 5-325 MG PO TABS: 2.0000 | ORAL_TABLET | Freq: Four times a day (QID) | ORAL | Status: AC | PRN

## 2012-07-18 MED ORDER — PENICILLIN V POTASSIUM 500 MG PO TABS: 500.0000 mg | ORAL_TABLET | Freq: Three times a day (TID) | ORAL | Status: AC

## 2012-07-18 NOTE — ED Provider Notes (Signed)
History     CSN: [id number]  Arrival date & time 07/18/12  1433   First MD Initiated Contact with Patient 07/18/12 1525      Chief Complaint  Patient presents with  . Oral Swelling    (Consider location/radiation/quality/duration/timing/severity/associated sxs/prior treatment) HPI Comments: Patient presents urgent care complaining that since last night she has progressively gotten worse with a left lower constant throbbing dental pain and swelling on her left lower jaw that started this morning. She denies any fevers facial swelling or headaches. She does describes she's been having problems with that particular to that somewhat she has not seen a dentist recently.  Patient is a 67 y.o. female presenting with tooth pain. The history is provided by the patient.  Dental Pain Location:  Lower Lower teeth location:  18/LL 2nd molar Quality:  Pulsating and throbbing Severity:  Moderate Onset quality:  Gradual Timing:  Intermittent Progression:  Worsening Relieved by:  Nothing Worsened by:  Nothing tried Ineffective treatments:  None tried Associated symptoms: no fever, no neck pain and no oral lesions   Risk factors: lack of dental care   Risk factors: no alcohol problem, no cancer, no chewing tobacco use, no diabetes and no immunosuppression     History reviewed. No pertinent past medical history.  History reviewed. No pertinent past surgical history.  No family history on file.  History  Substance Use Topics  . Smoking status: Former Games developer  . Smokeless tobacco: Never Used  . Alcohol Use: No    OB History   Grav Para Term Preterm Abortions TAB SAB Ect Mult Living                  Review of Systems  Constitutional: Negative for fever, activity change, appetite change and unexpected weight change.  HENT: Positive for dental problem. Negative for sore throat, mouth sores, trouble swallowing, neck pain, neck stiffness and sinus pressure.   Musculoskeletal: Negative for  myalgias, joint swelling and arthralgias.  Skin: Negative for color change, pallor and rash.    Allergies  Review of patient's allergies indicates no known allergies.  Home Medications   Current Outpatient Rx  Name  Route  Sig  Dispense  Refill  . cetirizine (ZYRTEC) 10 MG tablet      TAKE 1 TABLET BY MOUTH ONCE A DAY   90 tablet   3   . Fluticasone-Salmeterol (ADVAIR DISKUS) 250-50 MCG/DOSE AEPB   Inhalation   Inhale 1 puff into the lungs 2 (two) times daily.   60 each   6   . hydrochlorothiazide (HYDRODIURIL) 12.5 MG tablet   Oral   Take 1 tablet (12.5 mg total) by mouth daily.   90 tablet   3   . LORazepam (ATIVAN) 0.5 MG tablet      TAKE 1 TABLET BY MOUTH TWICE A DAY AS NEEDED FOR ANXIETY   60 tablet   5   . nortriptyline (PAMELOR) 50 MG capsule      ONE TABLET BY MOUTH IN THE MORNING AND TWO TABLETS BY MOUTH BEFORE BEDTIME.   90 capsule   11   . omeprazole (PRILOSEC) 20 MG capsule      TAKE ONE CAPSULE EVERY DAY   30 capsule   4   . PROAIR HFA 108 (90 BASE) MCG/ACT inhaler      USE 1-2 PUFFS EVERY 4 HOURS AS NEEDED FOR WHEEZING   18 each   3   . simvastatin (ZOCOR) 20 MG tablet  Oral   Take 1 tablet (20 mg total) by mouth at bedtime.   90 tablet   3   . SPIRIVA HANDIHALER 18 MCG inhalation capsule      USE 1 INHALATION DAILY   30 each   12     BP 143/91  Pulse 94  Temp(Src) 97.8 F (36.6 C) (Oral)  Resp 21  SpO2 93%  Physical Exam  Nursing note and vitals reviewed. Constitutional: She appears well-developed and well-nourished. No distress.  HENT:  Head: No trismus in the jaw.  Mouth/Throat: Uvula is midline. No oropharyngeal exudate, posterior oropharyngeal edema, posterior oropharyngeal erythema or tonsillar abscesses.    Eyes: Conjunctivae, EOM and lids are normal. Pupils are equal, round, and reactive to light.  Neck: Neck supple. No JVD present.  Pulmonary/Chest: Effort normal and breath sounds normal.  Lymphadenopathy:     She has no cervical adenopathy.  Neurological: She is alert.  Skin: No rash noted. No erythema.    ED Course  Procedures (including critical care time)  Labs Reviewed - No data to display No results found.   No diagnosis found.    MDM  Dental abscess, patient will be started on penicillin as a been given 10 tablets of Lortab. Have been encouraged to call the dentist on call Dr. Oswaldo Done tomorrow for followup.  Rx PCN Rx Lortab-      Jimmie Molly, MD 07/18/12 [phone]

## 2012-07-18 NOTE — ED Notes (Signed)
C/o possible tooth abscess, sx started last night

## 2012-08-07 ENCOUNTER — Other Ambulatory Visit: Payer: Self-pay | Admitting: Family Medicine

## 2012-09-10 ENCOUNTER — Telehealth: Payer: Self-pay | Admitting: Family Medicine

## 2012-09-10 NOTE — Telephone Encounter (Signed)
Patient is calling requesting a refill on Nortriptyline sent to [facility] in [place] and that # is [phone].  She has enough to last until Monday.

## 2012-09-10 NOTE — Telephone Encounter (Signed)
Will fwd to MD.  Shanavia Makela L, CMA  

## 2012-09-13 MED ORDER — NORTRIPTYLINE HCL 50 MG PO CAPS: ORAL_CAPSULE | ORAL | Status: DC

## 2012-09-13 NOTE — Telephone Encounter (Signed)
Pt notified.  Jaymie Misch L, CMA  

## 2012-09-15 ENCOUNTER — Other Ambulatory Visit: Payer: Self-pay | Admitting: Family Medicine

## 2012-10-08 ENCOUNTER — Other Ambulatory Visit: Payer: Self-pay | Admitting: Family Medicine

## 2012-10-31 ENCOUNTER — Other Ambulatory Visit: Payer: Self-pay | Admitting: Family Medicine

## 2013-01-10 ENCOUNTER — Other Ambulatory Visit: Payer: Self-pay | Admitting: Family Medicine

## 2013-01-18 ENCOUNTER — Other Ambulatory Visit: Payer: Self-pay | Admitting: Family Medicine

## 2013-02-15 ENCOUNTER — Other Ambulatory Visit: Payer: Self-pay | Admitting: Family Medicine

## 2013-03-16 ENCOUNTER — Ambulatory Visit (INDEPENDENT_AMBULATORY_CARE_PROVIDER_SITE_OTHER): Payer: Medicare Other | Admitting: Family Medicine

## 2013-03-16 ENCOUNTER — Encounter: Payer: Self-pay | Admitting: Family Medicine

## 2013-03-16 VITALS — BP 144/90 | HR 98 | Temp 97.8°F | Ht 60.0 in | Wt 203.4 lb

## 2013-03-16 DIAGNOSIS — J441 Chronic obstructive pulmonary disease with (acute) exacerbation: Secondary | ICD-10-CM | POA: Insufficient documentation

## 2013-03-16 DIAGNOSIS — I10 Essential (primary) hypertension: Secondary | ICD-10-CM

## 2013-03-16 DIAGNOSIS — J449 Chronic obstructive pulmonary disease, unspecified: Secondary | ICD-10-CM

## 2013-03-16 DIAGNOSIS — E7800 Pure hypercholesterolemia, unspecified: Secondary | ICD-10-CM

## 2013-03-16 DIAGNOSIS — F3289 Other specified depressive episodes: Secondary | ICD-10-CM

## 2013-03-16 DIAGNOSIS — Z23 Encounter for immunization: Secondary | ICD-10-CM

## 2013-03-16 DIAGNOSIS — F329 Major depressive disorder, single episode, unspecified: Secondary | ICD-10-CM

## 2013-03-16 LAB — LIPID PANEL
Cholesterol: 197 mg/dL (ref 0–200)
HDL: 72 mg/dL (ref >39)
LDL CALC: 99 mg/dL (ref 0–99)
Total CHOL/HDL Ratio: 2.7 Ratio
Triglycerides: 130 mg/dL (ref <150)
VLDL: 26 mg/dL (ref 0–40)

## 2013-03-16 LAB — CBC
HEMATOCRIT: 40.3 % (ref 36.0–46.0)
HEMOGLOBIN: 13.5 g/dL (ref 12.0–15.0)
MCH: 30.8 pg (ref 26.0–34.0)
MCHC: 33.5 g/dL (ref 30.0–36.0)
MCV: 92 fL (ref 78.0–100.0)
Platelets: 316 10*3/uL (ref 150–400)
RBC: 4.38 MIL/uL (ref 3.87–5.11)
RDW: 13.7 % (ref 11.5–15.5)
WBC: 11 10*3/uL — AB (ref 4.0–10.5)

## 2013-03-16 LAB — COMPLETE METABOLIC PANEL WITH GFR
ALBUMIN: 4.3 g/dL (ref 3.5–5.2)
ALT: 25 U/L (ref 0–35)
AST: 22 U/L (ref 0–37)
Alkaline Phosphatase: 101 U/L (ref 39–117)
BUN: 8 mg/dL (ref 6–23)
CALCIUM: 9.5 mg/dL (ref 8.4–10.5)
CHLORIDE: 96 meq/L (ref 96–112)
CO2: 31 mEq/L (ref 19–32)
Creat: 0.66 mg/dL (ref 0.50–1.10)
GFR, Est African American: >89 mL/min
GFR, Est Non African American: >89 mL/min
Glucose, Bld: 95 mg/dL (ref 70–99)
Potassium: 4 mEq/L (ref 3.5–5.3)
SODIUM: 137 meq/L (ref 135–145)
Total Bilirubin: 0.4 mg/dL (ref 0.2–1.2)
Total Protein: 7.2 g/dL (ref 6.0–8.3)

## 2013-03-16 MED ORDER — SIMVASTATIN 20 MG PO TABS: 20.0000 mg | ORAL_TABLET | Freq: Every day | ORAL | Status: DC

## 2013-03-16 MED ORDER — LORAZEPAM 0.5 MG PO TABS: 0.5000 mg | ORAL_TABLET | Freq: Three times a day (TID) | ORAL | Status: DC | PRN

## 2013-03-16 MED ORDER — FLUTICASONE PROPIONATE HFA 220 MCG/ACT IN AERO: 2.0000 | INHALATION_SPRAY | Freq: Two times a day (BID) | RESPIRATORY_TRACT | Status: AC

## 2013-03-16 MED ORDER — ALBUTEROL SULFATE HFA 108 (90 BASE) MCG/ACT IN AERS: INHALATION_SPRAY | RESPIRATORY_TRACT | Status: DC

## 2013-03-16 MED ORDER — PREDNISONE 20 MG PO TABS: 20.0000 mg | ORAL_TABLET | Freq: Three times a day (TID) | ORAL | Status: AC

## 2013-03-16 MED ORDER — TIOTROPIUM BROMIDE MONOHYDRATE 18 MCG IN CAPS: 18.0000 ug | ORAL_CAPSULE | Freq: Every day | RESPIRATORY_TRACT | Status: DC

## 2013-03-16 MED ORDER — HYDROCHLOROTHIAZIDE 25 MG PO TABS: 25.0000 mg | ORAL_TABLET | Freq: Every day | ORAL | Status: AC

## 2013-03-16 MED ORDER — NORTRIPTYLINE HCL 50 MG PO CAPS: ORAL_CAPSULE | ORAL | Status: DC

## 2013-03-16 NOTE — Assessment & Plan Note (Addendum)
Could not afford controler.  Prednisone until inhaled steroid kicks in

## 2013-03-16 NOTE — Patient Instructions (Addendum)
I am sorry for all your troubles over the past year.  You could be a soap opera all by yourself.  Its very sad. I am worried about your lungs.  The plan is to treat you with prednisone for a week until the new inhaler kicks in. The new inhaler should be cheaper than the advair and contains the most important of the two medicines that were in the advair.   You will get a flu and pnuemonia shot today.   I will call with blood work results. I will mail your paperwork I would like to see you every 3-4 months when you are in town.

## 2013-03-17 ENCOUNTER — Encounter: Payer: Self-pay | Admitting: Family Medicine

## 2013-03-17 NOTE — Progress Notes (Signed)
   Subjective:    Patient ID: Samantha HawStella C Coffey, female    DOB: 1945/03/14, 68 y.o.   MRN: [id number]  HPI Depressed and needs meds refilled Terrible year.   Mother died. (expected but still emotionally hard) Lost family land due to road Education officer, museumconstruction and eminent domain. Proceeds tied up in court due to other family member not paying taxes. Was on disability and lost it.  Now told she owes $18K in back payments. Son died of suspicious causes. No HI or SI  [place]  Out of advair, she cannot afford.  Wheezing and frequent use of MDI albuterol over past two weeks.    Review of Systems     Objective:   Physical ExamTeary in office while telling story. Lungs diffuse wheezing.        Assessment & Plan:

## 2013-03-17 NOTE — Assessment & Plan Note (Signed)
Switch from advair to flovent for cost reasons.

## 2013-03-17 NOTE — Assessment & Plan Note (Signed)
Severe.  Wants slight increase in lorazepam.  Feels better reconnecting with me.  Is talking with friends and family and has good support.

## 2013-06-24 ENCOUNTER — Other Ambulatory Visit: Payer: Self-pay | Admitting: Family Medicine

## 2013-07-17 ENCOUNTER — Other Ambulatory Visit: Payer: Self-pay | Admitting: Family Medicine

## 2013-10-05 ENCOUNTER — Other Ambulatory Visit: Payer: Self-pay | Admitting: Family Medicine

## 2013-10-05 NOTE — Telephone Encounter (Signed)
Will refill med.  Although she now lives in [place], she plans to continue to see me and has in fact seen me in the past 6 months.

## 2013-12-15 ENCOUNTER — Other Ambulatory Visit: Payer: Self-pay | Admitting: Family Medicine

## 2013-12-15 DIAGNOSIS — J309 Allergic rhinitis, unspecified: Secondary | ICD-10-CM | POA: Insufficient documentation

## 2014-03-25 ENCOUNTER — Other Ambulatory Visit: Payer: Self-pay | Admitting: Family Medicine

## 2014-04-10 ENCOUNTER — Other Ambulatory Visit: Payer: Self-pay | Admitting: Family Medicine

## 2014-05-24 ENCOUNTER — Other Ambulatory Visit: Payer: Self-pay | Admitting: *Deleted

## 2014-05-24 MED ORDER — SIMVASTATIN 20 MG PO TABS: 20.0000 mg | ORAL_TABLET | Freq: Every day | ORAL | Status: DC

## 2014-06-05 ENCOUNTER — Other Ambulatory Visit: Payer: Self-pay | Admitting: Family Medicine

## 2014-06-11 ENCOUNTER — Other Ambulatory Visit: Payer: Self-pay | Admitting: Family Medicine

## 2014-07-09 ENCOUNTER — Other Ambulatory Visit: Payer: Self-pay | Admitting: Family Medicine

## 2014-07-21 ENCOUNTER — Other Ambulatory Visit: Payer: Self-pay | Admitting: Family Medicine

## 2015-04-04 ENCOUNTER — Other Ambulatory Visit: Payer: Self-pay | Admitting: Family Medicine

## 2015-05-01 ENCOUNTER — Other Ambulatory Visit: Payer: Self-pay | Admitting: Family Medicine

## 2015-06-16 ENCOUNTER — Other Ambulatory Visit: Payer: Self-pay | Admitting: Family Medicine

## 2015-06-18 ENCOUNTER — Other Ambulatory Visit: Payer: Self-pay | Admitting: Family Medicine

## 2015-07-17 ENCOUNTER — Other Ambulatory Visit: Payer: Self-pay | Admitting: Family Medicine

## 2015-10-12 ENCOUNTER — Other Ambulatory Visit: Payer: Self-pay | Admitting: Family Medicine

## 2016-03-21 ENCOUNTER — Other Ambulatory Visit: Payer: Self-pay | Admitting: Family Medicine

## 2016-04-18 ENCOUNTER — Other Ambulatory Visit: Payer: Self-pay | Admitting: Family Medicine

## 2016-05-28 ENCOUNTER — Other Ambulatory Visit: Payer: Self-pay | Admitting: Family Medicine

## 2016-06-03 ENCOUNTER — Other Ambulatory Visit: Payer: Self-pay | Admitting: Family Medicine

## 2016-06-05 ENCOUNTER — Other Ambulatory Visit: Payer: Self-pay | Admitting: Family Medicine

## 2016-09-28 ENCOUNTER — Other Ambulatory Visit: Payer: Self-pay | Admitting: Family Medicine

## 2016-12-09 ENCOUNTER — Other Ambulatory Visit: Payer: Self-pay | Admitting: Family Medicine

## 2018-01-26 ENCOUNTER — Other Ambulatory Visit: Payer: Self-pay | Admitting: Family Medicine

## 2018-04-19 ENCOUNTER — Other Ambulatory Visit: Payer: Self-pay | Admitting: Family Medicine

## 2018-05-17 ENCOUNTER — Other Ambulatory Visit: Payer: Self-pay | Admitting: Family Medicine

## 2019-05-25 ENCOUNTER — Other Ambulatory Visit: Payer: Self-pay | Admitting: Family Medicine

## 2020-07-16 ENCOUNTER — Other Ambulatory Visit: Payer: Self-pay | Admitting: Family Medicine

## 2021-04-10 DEATH — deceased

## 2021-06-10 DEATH — deceased
